# Patient Record
Sex: Male | Born: 1955 | Hispanic: No | Marital: Single | Smoking: Current every day smoker
Health system: Southern US, Community
[De-identification: ages and names within clinical notes are randomized; demographics above are authoritative.]

## PROBLEM LIST (undated history)

## (undated) DIAGNOSIS — I10 Essential (primary) hypertension: Secondary | ICD-10-CM

## (undated) DIAGNOSIS — S7290XA Unspecified fracture of unspecified femur, initial encounter for closed fracture: Secondary | ICD-10-CM

## (undated) DIAGNOSIS — M1990 Unspecified osteoarthritis, unspecified site: Secondary | ICD-10-CM

## (undated) DIAGNOSIS — R39198 Other difficulties with micturition: Secondary | ICD-10-CM

## (undated) DIAGNOSIS — M109 Gout, unspecified: Secondary | ICD-10-CM

---

## 2008-09-07 HISTORY — PX: CARPAL TUNNEL RELEASE: SHX101

## 2014-11-20 ENCOUNTER — Telehealth: Payer: Self-pay

## 2014-11-20 NOTE — Telephone Encounter (Signed)
PATIENT RECEIVED LETTER TO SCHEDULE COLONOSCOPY  PLEASE CALL [phone]

## 2014-11-22 NOTE — Telephone Encounter (Signed)
LMOM to call.

## 2014-11-23 NOTE — Telephone Encounter (Signed)
Pt called and said he will get his med list and contact his insurance co and call back to schedule.

## 2014-11-28 NOTE — Telephone Encounter (Signed)
Letter mailed to pt to call to schedule colonoscopy.  

## 2015-01-24 ENCOUNTER — Other Ambulatory Visit (HOSPITAL_COMMUNITY): Payer: Self-pay | Admitting: Internal Medicine

## 2015-01-24 DIAGNOSIS — S8992XA Unspecified injury of left lower leg, initial encounter: Secondary | ICD-10-CM

## 2015-01-24 DIAGNOSIS — F4310 Post-traumatic stress disorder, unspecified: Secondary | ICD-10-CM

## 2015-02-06 ENCOUNTER — Ambulatory Visit (HOSPITAL_COMMUNITY)
Admission: RE | Admit: 2015-02-06 | Discharge: 2015-02-06 | Disposition: A | Discharge status: Home / Self Care | Payer: BLUE CROSS/BLUE SHIELD | Source: Ambulatory Visit | Attending: Internal Medicine | Admitting: Internal Medicine

## 2015-02-06 DIAGNOSIS — M25562 Pain in left knee: Secondary | ICD-10-CM | POA: Diagnosis present

## 2015-02-06 DIAGNOSIS — M7989 Other specified soft tissue disorders: Secondary | ICD-10-CM | POA: Diagnosis not present

## 2015-02-06 DIAGNOSIS — S72142A Displaced intertrochanteric fracture of left femur, initial encounter for closed fracture: Secondary | ICD-10-CM | POA: Diagnosis not present

## 2015-02-06 DIAGNOSIS — F4310 Post-traumatic stress disorder, unspecified: Secondary | ICD-10-CM

## 2015-02-06 DIAGNOSIS — W19XXXA Unspecified fall, initial encounter: Secondary | ICD-10-CM | POA: Diagnosis not present

## 2015-02-06 DIAGNOSIS — N323 Diverticulum of bladder: Secondary | ICD-10-CM | POA: Diagnosis not present

## 2015-02-06 DIAGNOSIS — S8992XA Unspecified injury of left lower leg, initial encounter: Secondary | ICD-10-CM

## 2015-02-15 ENCOUNTER — Ambulatory Visit: Payer: Self-pay | Admitting: Orthopedic Surgery

## 2015-02-20 NOTE — Progress Notes (Signed)
Left message to set up pre op 02/15/15  0955 LM 6/13  0940 LM 02/19/15 1140 430pm 02/19/15 verified correct phone number with Parke Poisson at Dr [place] April 6.15/16 patient returned call. York Spaniel has been calling- hasnt left any message at [phone].  Give PST appt for 02/22/15--- Left message with Parke Poisson of appt

## 2015-02-22 ENCOUNTER — Encounter (HOSPITAL_COMMUNITY): Payer: Self-pay

## 2015-02-22 ENCOUNTER — Encounter (HOSPITAL_COMMUNITY)
Admission: RE | Admit: 2015-02-22 | Discharge: 2015-02-22 | Disposition: A | Discharge status: Home / Self Care | Payer: BLUE CROSS/BLUE SHIELD | Source: Ambulatory Visit | Attending: Orthopedic Surgery | Admitting: Orthopedic Surgery

## 2015-02-22 DIAGNOSIS — Z01818 Encounter for other preprocedural examination: Secondary | ICD-10-CM | POA: Insufficient documentation

## 2015-02-22 HISTORY — DX: Essential (primary) hypertension: I10

## 2015-02-22 HISTORY — DX: Unspecified osteoarthritis, unspecified site: M19.90

## 2015-02-22 HISTORY — DX: Gout, unspecified: M10.9

## 2015-02-22 HISTORY — DX: Other difficulties with micturition: R39.198

## 2015-02-22 HISTORY — DX: Unspecified fracture of unspecified femur, initial encounter for closed fracture: S72.90XA

## 2015-02-22 LAB — COMPREHENSIVE METABOLIC PANEL
ALBUMIN: 3.6 g/dL (ref 3.5–5.0)
ALK PHOS: 146 U/L — AB (ref 38–126)
ALT: 21 U/L (ref 17–63)
AST: 22 U/L (ref 15–41)
Anion gap: 9 (ref 5–15)
BILIRUBIN TOTAL: 0.1 mg/dL — AB (ref 0.3–1.2)
BUN: 6 mg/dL (ref 6–20)
CO2: 34 mmol/L — ABNORMAL HIGH (ref 22–32)
Calcium: 9.4 mg/dL (ref 8.9–10.3)
Chloride: 96 mmol/L — ABNORMAL LOW (ref 101–111)
Creatinine, Ser: 0.67 mg/dL (ref 0.61–1.24)
GFR calc Af Amer: >60 mL/min (ref >60)
GFR calc non Af Amer: >60 mL/min (ref >60)
GLUCOSE: 105 mg/dL — AB (ref 65–99)
POTASSIUM: 4.3 mmol/L (ref 3.5–5.1)
SODIUM: 139 mmol/L (ref 135–145)
Total Protein: 7.7 g/dL (ref 6.5–8.1)

## 2015-02-22 LAB — ABO/RH: ABO/RH(D): O NEG

## 2015-02-22 LAB — CBC
HCT: 43.7 % (ref 39.0–52.0)
Hemoglobin: 14.2 g/dL (ref 13.0–17.0)
MCH: 28.1 pg (ref 26.0–34.0)
MCHC: 32.5 g/dL (ref 30.0–36.0)
MCV: 86.4 fL (ref 78.0–100.0)
PLATELETS: 344 10*3/uL (ref 150–400)
RBC: 5.06 MIL/uL (ref 4.22–5.81)
RDW: 14.6 % (ref 11.5–15.5)
WBC: 9.4 10*3/uL (ref 4.0–10.5)

## 2015-02-22 LAB — PROTIME-INR
INR: 0.93 (ref 0.00–1.49)
Prothrombin Time: 12.7 seconds (ref 11.6–15.2)

## 2015-02-22 LAB — URINALYSIS, ROUTINE W REFLEX MICROSCOPIC
Bilirubin Urine: NEGATIVE
Glucose, UA: NEGATIVE mg/dL
HGB URINE DIPSTICK: NEGATIVE
Ketones, ur: NEGATIVE mg/dL
Leukocytes, UA: NEGATIVE
Nitrite: NEGATIVE
Protein, ur: NEGATIVE mg/dL
SPECIFIC GRAVITY, URINE: 1.012 (ref 1.005–1.030)
Urobilinogen, UA: 1 mg/dL (ref 0.0–1.0)
pH: 7 (ref 5.0–8.0)

## 2015-02-22 LAB — SURGICAL PCR SCREEN
MRSA, PCR: NEGATIVE
Staphylococcus aureus: POSITIVE — AB

## 2015-02-22 LAB — APTT: APTT: 34 s (ref 24–37)

## 2015-02-22 NOTE — H&P (Signed)
TOTAL HIP ADMISSION H&P  Patient is admitted for left total hip arthroplasty.  Subjective:  Chief Complaint: left hip pain  HPI: Chris Drake, 59 y.o. male, has a history of pain and functional disability in the left hip(s) due to trauma and arthritis and patient has failed non-surgical conservative treatments for greater than 12 weeks to include NSAID's and/or analgesics, use of assistive devices and activity modification.  Onset of symptoms was abrupt starting 3 months ago with rapidlly worsening course since that time.The patient noted no past surgery on the left hip(s).  Patient currently rates pain in the left hip at 8 out of 10 with activity. Patient has night pain, worsening of pain with activity and weight bearing, pain that interfers with activities of daily living and pain with passive range of motion. Patient has evidence of periarticular osteophytes, joint space narrowing and nonunion interochanteric fracture by imaging studies. This condition presents safety issues increasing the risk of falls. There is no current active infection.  Past Medical History Hypertension Urinary retention  Past Surgical History Carpal Tunnel Repair left Cataract Surgery right  Medications Medication History (Faith Lana Fish; 02/14/2015 3:51 PM) OxyCODONE HCl (  Tablet, Oral) Active. Fish Oil Active. Calcium (Oral)  - Active. Magnesium (Oral)  - Active. Vitamin D3 (Oral) - Active. Lisinopril (  Tablet, Oral) Active. Aspirin EC (  Tablet DR, Oral) Active.  NKDA  History  Substance Use Topics  . Smoking status: Smokes 1/2 pack per day  . Smokeless tobacco: Not on file  . Alcohol Use: "Occasional beer"      Review of Systems  Constitutional: Negative.   HENT: Negative.   Eyes: Negative.   Respiratory: Negative.   Cardiovascular: Negative.   Gastrointestinal: Negative.   Genitourinary: Negative.   Musculoskeletal: Positive for back pain, joint pain and falls. Negative  for myalgias and neck pain.       Left hip pain  Skin: Negative.   Neurological: Negative.   Endo/Heme/Allergies: Positive for environmental allergies. Negative for polydipsia. Does not bruise/bleed easily.  Psychiatric/Behavioral: Negative.     Objective:  Physical Exam  Constitutional: He is oriented to person, place, and time. He appears well-developed and well-nourished. No distress.  HENT:  Head: Normocephalic and atraumatic.  Right Ear: External ear normal.  Left Ear: External ear normal.  Mouth/Throat: Oropharynx is clear and moist.  Eyes: Conjunctivae and EOM are normal.  Neck: Normal range of motion. Neck supple.  Cardiovascular: Regular rhythm, normal heart sounds and intact distal pulses.  Tachycardia present.   No murmur heard. Respiratory: Effort normal and breath sounds normal. No respiratory distress. He has no wheezes.  GI: Soft. Bowel sounds are normal. He exhibits no distension. There is no tenderness.  Musculoskeletal:       Right hip: Normal.       Left hip: He exhibits decreased range of motion and decreased strength.       Right knee: Normal.       Left knee: Normal.  Neurological: He is alert and oriented to person, place, and time. He has normal strength and normal reflexes. No sensory deficit.  Skin: No rash noted. He is not diaphoretic. No erythema.  Psychiatric: He has a normal mood and affect. His behavior is normal.     Vitals  Weight: 185 lb Height: 68in Body Surface Area: 1.98 m Body Mass Index: 28.13 kg/m  Pulse: 104 (Regular)  BP: 124/78 (Sitting, Left Arm, Standard)  Imaging Review Plain radiographs demonstrate severe degenerative joint disease of the  left hip(s) as well as nonunion of intertroch fracture. The bone quality appears to be fair for age and reported activity level.  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 The treatment options including medical management, injection therapy, arthroscopy and arthroplasty were discussed at length. The risks and benefits of total hip arthroplasty were presented and reviewed. The risks due to aseptic loosening, infection, stiffness, dislocation/subluxation,  thromboembolic complications and other imponderables were discussed.  The patient acknowledged the explanation, agreed to proceed with the plan and consent was signed. Patient is being admitted for inpatient treatment for surgery, pain control, PT, OT, prophylactic antibiotics, VTE prophylaxis, progressive ambulation and ADL's and discharge planning.The patient is planning to be discharged home with home health services    TXA IV   Dimitri Ped, PA-C

## 2015-02-22 NOTE — Patient Instructions (Addendum)
YOUR PROCEDURE IS SCHEDULED ON :  02/26/15  REPORT TO [facility] MAIN ENTRANCE FOLLOW SIGNS TO SHORT STAY CENTER AT :  3:00 pm  CALL THIS NUMBER IF YOU HAVE PROBLEMS THE MORNING OF SURGERY [phone]  REMEMBER:ONLY 1 PER PERSON MAY GO TO SHORT STAY WITH YOU TO GET READY THE MORNING OF YOUR SURGERY  DO NOT EAT FOOD  AFTER MIDNIGHT  MAY HAVE CLEAR LIQUIDS UNTIL 11:00 AM  TAKE THESE MEDICINES THE MORNING OF SURGERY: ZYRTEC / MAY TAKE OXYCODONE OR OXYCONTIN IF NEEDED FOR PAIN     CLEAR LIQUID DIET   Foods Allowed                                                                     Foods Excluded  Coffee and tea, regular and decaf                             liquids that you cannot  Plain Jell-O in any flavor                                             see through such as: Fruit ices (not with fruit pulp)                                     milk, soups, orange juice  Iced Popsicles                                                 All solid food Carbonated beverages, regular and diet                                    Cranberry, grape and apple juices Sports drinks like Gatorade Lightly seasoned clear broth or consume(fat free) Sugar, honey syrup   _____________________________________________________________________    YOU MAY NOT HAVE ANY METAL ON YOUR BODY INCLUDING HAIR PINS AND PIERCING'S. DO NOT WEAR JEWELRY, MAKEUP, LOTIONS, POWDERS OR PERFUMES. DO NOT WEAR NAIL POLISH. DO NOT SHAVE 48 HRS PRIOR TO SURGERY. MEN MAY SHAVE FACE AND NECK.  DO NOT BRING VALUABLES TO HOSPITAL. [facility] IS NOT RESPONSIBLE FOR VALUABLES.  CONTACTS, DENTURES OR PARTIALS MAY NOT BE WORN TO SURGERY. LEAVE SUITCASE IN CAR. CAN BE BROUGHT TO ROOM AFTER SURGERY.  PATIENTS DISCHARGED THE DAY OF SURGERY WILL NOT BE ALLOWED TO DRIVE HOME.  PLEASE READ OVER THE FOLLOWING INSTRUCTION SHEETS _________________________________________________________________________________                                      [facility] - PREPARING FOR SURGERY  Before surgery, you can play an important role.  Because skin is not sterile, your skin needs to  be as free of germs as possible.  You can reduce the number of germs on your skin by washing with CHG (chlorahexidine gluconate) soap before surgery.  CHG is an antiseptic cleaner which kills germs and bonds with the skin to continue killing germs even after washing. Please DO NOT use if you have an allergy to CHG or antibacterial soaps.  If your skin becomes reddened/irritated stop using the CHG and inform your nurse when you arrive at Short Stay. Do not shave (including legs and underarms) for at least 48 hours prior to the first CHG shower.  You may shave your face. Please follow these instructions carefully:   1.  Shower with CHG Soap the night before surgery and the  morning of Surgery.   2.  If you choose to wash your hair, wash your hair first as usual with your  normal  Shampoo.   3.  After you shampoo, rinse your hair and body thoroughly to remove the  shampoo.                                         4.  Use CHG as you would any other liquid soap.  You can apply chg directly  to the skin and wash . Gently wash with scrungie or clean wascloth    5.  Apply the CHG Soap to your body ONLY FROM THE NECK DOWN.   Do not use on open                           Wound or open sores. Avoid contact with eyes, ears mouth and genitals (private parts).                        Genitals (private parts) with your normal soap.              6.  Wash thoroughly, paying special attention to the area where your surgery  will be performed.   7.  Thoroughly rinse your body with warm water from the neck down.   8.  DO NOT shower/wash with your normal soap after using and rinsing off  the CHG Soap .                9.  Pat yourself dry with a clean towel.             10.  Wear clean night clothes to bed after shower             11.  Place  clean sheets on your bed the night of your first shower and do not  sleep with pets.  Day of Surgery : Do not apply any lotions/deodorants the morning of surgery.  Please wear clean clothes to the hospital/surgery center.  FAILURE TO FOLLOW THESE INSTRUCTIONS MAY RESULT IN THE CANCELLATION OF YOUR SURGERY    PATIENT SIGNATURE_________________________________  ______________________________________________________________________     Chris Drake  An incentive spirometer is a tool that can help keep your lungs clear and active. This tool measures how well you are filling your lungs with each breath. Taking long deep breaths may help reverse or decrease the chance of developing breathing (pulmonary) problems (especially infection) following:  A long period of time when you are unable to move or be active. BEFORE THE PROCEDURE   If  the spirometer includes an indicator to show your best effort, your nurse or respiratory therapist will set it to a desired goal.  If possible, sit up straight or lean slightly forward. Try not to slouch.  Hold the incentive spirometer in an upright position. INSTRUCTIONS FOR USE   Sit on the edge of your bed if possible, or sit up as far as you can in bed or on a chair.  Hold the incentive spirometer in an upright position.  Breathe out normally.  Place the mouthpiece in your mouth and seal your lips tightly around it.  Breathe in slowly and as deeply as possible, raising the piston or the ball toward the top of the column.  Hold your breath for 3-5 seconds or for as long as possible. Allow the piston or ball to fall to the bottom of the column.  Remove the mouthpiece from your mouth and breathe out normally.  Rest for a few seconds and repeat Steps 1 through 7 at least 10 times every 1-2 hours when you are awake. Take your time and take a few normal breaths between deep breaths.  The spirometer may include an indicator to show your best  effort. Use the indicator as a goal to work toward during each repetition.  After each set of 10 deep breaths, practice coughing to be sure your lungs are clear. If you have an incision (the cut made at the time of surgery), support your incision when coughing by placing a pillow or rolled up towels firmly against it. Once you are able to get out of bed, walk around indoors and cough well. You may stop using the incentive spirometer when instructed by your caregiver.  RISKS AND COMPLICATIONS  Take your time so you do not get dizzy or light-headed.  If you are in pain, you may need to take or ask for pain medication before doing incentive spirometry. It is harder to take a deep breath if you are having pain. AFTER USE  Rest and breathe slowly and easily.  It can be helpful to keep track of a log of your progress. Your caregiver can provide you with a simple table to help with this. If you are using the spirometer at home, follow these instructions: SEEK MEDICAL CARE IF:   You are having difficultly using the spirometer.  You have trouble using the spirometer as often as instructed.  Your pain medication is not giving enough relief while using the spirometer.  You develop fever of 100.5 F (38.1 C) or higher. SEEK IMMEDIATE MEDICAL CARE IF:   You cough up bloody sputum that had not been present before.  You develop fever of 102 F (38.9 C) or greater.  You develop worsening pain at or near the incision site. MAKE SURE YOU:   Understand these instructions.  Will watch your condition.  Will get help right away if you are not doing well or get worse. Document Released: 01/04/2007 Document Revised: 11/16/2011 Document Reviewed: 03/07/2007 [employer] Patient Information 2014 [employer], [place].   ________________________________________________________________________  WHAT IS A BLOOD TRANSFUSION? Blood Transfusion Information  A transfusion is the replacement of blood or some of  its parts. Blood is made up of multiple cells which provide different functions.  Red blood cells carry oxygen and are used for blood loss replacement.  White blood cells fight against infection.  Platelets control bleeding.  Plasma helps clot blood.  Other blood products are available for specialized needs, such as hemophilia or other clotting disorders.  BEFORE THE TRANSFUSION  Who gives blood for transfusions?   Healthy volunteers who are fully evaluated to make sure their blood is safe. This is blood bank blood. Transfusion therapy is the safest it has ever been in the practice of medicine. Before blood is taken from a donor, a complete history is taken to make sure that person has no history of diseases nor engages in risky social behavior (examples are intravenous drug use or sexual activity with multiple partners). The donor's travel history is screened to minimize risk of transmitting infections, such as malaria. The donated blood is tested for signs of infectious diseases, such as HIV and hepatitis. The blood is then tested to be sure it is compatible with you in order to minimize the chance of a transfusion reaction. If you or a relative donates blood, this is often done in anticipation of surgery and is not appropriate for emergency situations. It takes many days to process the donated blood. RISKS AND COMPLICATIONS Although transfusion therapy is very safe and saves many lives, the main dangers of transfusion include:   Getting an infectious disease.  Developing a transfusion reaction. This is an allergic reaction to something in the blood you were given. Every precaution is taken to prevent this. The decision to have a blood transfusion has been considered carefully by your caregiver before blood is given. Blood is not given unless the benefits outweigh the risks. AFTER THE TRANSFUSION  Right after receiving a blood transfusion, you will usually feel much better and more  energetic. This is especially true if your red blood cells have gotten low (anemic). The transfusion raises the level of the red blood cells which carry oxygen, and this usually causes an energy increase.  The nurse administering the transfusion will monitor you carefully for complications. HOME CARE INSTRUCTIONS  No special instructions are needed after a transfusion. You may find your energy is better. Speak with your caregiver about any limitations on activity for underlying diseases you may have. SEEK MEDICAL CARE IF:   Your condition is not improving after your transfusion.  You develop redness or irritation at the intravenous (IV) site. SEEK IMMEDIATE MEDICAL CARE IF:  Any of the following symptoms occur over the next 12 hours:  Shaking chills.  You have a temperature by mouth above 102 F (38.9 C), not controlled by medicine.  Chest, back, or muscle pain.  People around you feel you are not acting correctly or are confused.  Shortness of breath or difficulty breathing.  Dizziness and fainting.  You get a rash or develop hives.  You have a decrease in urine output.  Your urine turns a dark color or changes to pink, red, or brown. Any of the following symptoms occur over the next 10 days:  You have a temperature by mouth above 102 F (38.9 C), not controlled by medicine.  Shortness of breath.  Weakness after normal activity.  The white part of the eye turns yellow (jaundice).  You have a decrease in the amount of urine or are urinating less often.  Your urine turns a dark color or changes to pink, red, or brown. Document Released: 08/21/2000 Document Revised: 11/16/2011 Document Reviewed: 04/09/2008 [facility] Patient Information 2014 [place], [place].  _______________________________________________________________________

## 2015-02-25 NOTE — Anesthesia Preprocedure Evaluation (Addendum)
Anesthesia Evaluation  Patient identified by MRN, date of birth, ID band Patient awake    Reviewed: Allergy & Precautions, NPO status , Patient's Chart, lab work & pertinent test results  History of Anesthesia Complications Negative for: history of anesthetic complications  Airway Mallampati: II  TM Distance: >3 FB Neck ROM: Full    Dental no notable dental hx. (+) Dental Advisory Given   Pulmonary neg pulmonary ROS, Current Smoker,  breath sounds clear to auscultation  Pulmonary exam normal       Cardiovascular hypertension, Pt. on medications Normal cardiovascular examRhythm:Regular Rate:Normal     Neuro/Psych negative neurological ROS  negative psych ROS   GI/Hepatic negative GI ROS, Neg liver ROS,   Endo/Other  negative endocrine ROS  Renal/GU negative Renal ROS  negative genitourinary   Musculoskeletal negative musculoskeletal ROS (+) Arthritis -,   Abdominal   Peds negative pediatric ROS (+)  Hematology negative hematology ROS (+)   Anesthesia Other Findings   Reproductive/Obstetrics negative OB ROS                            Anesthesia Physical Anesthesia Plan  ASA: II  Anesthesia Plan: General   Post-op Pain Management:    Induction: Intravenous  Airway Management Planned: Oral ETT  Additional Equipment:   Intra-op Plan:   Post-operative Plan:   Informed Consent: I have reviewed the patients History and Physical, chart, labs and discussed the procedure including the risks, benefits and alternatives for the proposed anesthesia with the patient or authorized representative who has indicated his/her understanding and acceptance.   Dental advisory given  Plan Discussed with: CRNA and Surgeon  Anesthesia Plan Comments:        Anesthesia Quick Evaluation

## 2015-02-26 ENCOUNTER — Inpatient Hospital Stay (HOSPITAL_COMMUNITY): Payer: BLUE CROSS/BLUE SHIELD | Admitting: Anesthesiology

## 2015-02-26 ENCOUNTER — Inpatient Hospital Stay (HOSPITAL_COMMUNITY): Payer: BLUE CROSS/BLUE SHIELD

## 2015-02-26 ENCOUNTER — Encounter (HOSPITAL_COMMUNITY)
Admission: RE | Disposition: A | Discharge status: Skilled Nursing Facility | Payer: Self-pay | Source: Ambulatory Visit | Attending: Orthopedic Surgery

## 2015-02-26 ENCOUNTER — Inpatient Hospital Stay (HOSPITAL_COMMUNITY)
Admission: RE | Admit: 2015-02-26 | Discharge: 2015-03-04 | DRG: 470 | Disposition: A | Discharge status: Skilled Nursing Facility | Payer: BLUE CROSS/BLUE SHIELD | Source: Ambulatory Visit | Attending: Orthopedic Surgery | Admitting: Orthopedic Surgery

## 2015-02-26 ENCOUNTER — Encounter (HOSPITAL_COMMUNITY): Payer: Self-pay | Admitting: *Deleted

## 2015-02-26 DIAGNOSIS — Z01812 Encounter for preprocedural laboratory examination: Secondary | ICD-10-CM

## 2015-02-26 DIAGNOSIS — F17210 Nicotine dependence, cigarettes, uncomplicated: Secondary | ICD-10-CM | POA: Diagnosis present

## 2015-02-26 DIAGNOSIS — S72142K Displaced intertrochanteric fracture of left femur, subsequent encounter for closed fracture with nonunion: Principal | ICD-10-CM

## 2015-02-26 DIAGNOSIS — Z7982 Long term (current) use of aspirin: Secondary | ICD-10-CM | POA: Diagnosis not present

## 2015-02-26 DIAGNOSIS — M1612 Unilateral primary osteoarthritis, left hip: Secondary | ICD-10-CM | POA: Diagnosis present

## 2015-02-26 DIAGNOSIS — W19XXXD Unspecified fall, subsequent encounter: Secondary | ICD-10-CM | POA: Diagnosis present

## 2015-02-26 DIAGNOSIS — A150 Tuberculosis of lung: Secondary | ICD-10-CM

## 2015-02-26 DIAGNOSIS — Z96649 Presence of unspecified artificial hip joint: Secondary | ICD-10-CM

## 2015-02-26 DIAGNOSIS — S72002P Fracture of unspecified part of neck of left femur, subsequent encounter for closed fracture with malunion: Secondary | ICD-10-CM

## 2015-02-26 DIAGNOSIS — Z79899 Other long term (current) drug therapy: Secondary | ICD-10-CM

## 2015-02-26 DIAGNOSIS — R339 Retention of urine, unspecified: Secondary | ICD-10-CM | POA: Diagnosis present

## 2015-02-26 DIAGNOSIS — I10 Essential (primary) hypertension: Secondary | ICD-10-CM | POA: Diagnosis present

## 2015-02-26 DIAGNOSIS — M25552 Pain in left hip: Secondary | ICD-10-CM | POA: Diagnosis present

## 2015-02-26 DIAGNOSIS — S72009P Fracture of unspecified part of neck of unspecified femur, subsequent encounter for closed fracture with malunion: Secondary | ICD-10-CM | POA: Diagnosis present

## 2015-02-26 DIAGNOSIS — M169 Osteoarthritis of hip, unspecified: Secondary | ICD-10-CM | POA: Diagnosis present

## 2015-02-26 HISTORY — PX: TOTAL HIP ARTHROPLASTY: SHX124

## 2015-02-26 LAB — TYPE AND SCREEN
ABO/RH(D): O NEG
ABO/RH(D): O NEG
Antibody Screen: NEGATIVE
Antibody Screen: NEGATIVE

## 2015-02-26 SURGERY — ARTHROPLASTY, HIP, TOTAL,POSTERIOR APPROACH
Anesthesia: General | Site: Hip | Laterality: Left

## 2015-02-26 MED ORDER — DEXAMETHASONE SODIUM PHOSPHATE 10 MG/ML IJ SOLN
10.0000 mg | Freq: Once | INTRAMUSCULAR | Status: AC
  Administered 2015-02-27: 10 mg via INTRAVENOUS
  Filled 2015-02-26: qty 1

## 2015-02-26 MED ORDER — DEXAMETHASONE SODIUM PHOSPHATE 10 MG/ML IJ SOLN
INTRAMUSCULAR | Status: AC
  Filled 2015-02-26: qty 1

## 2015-02-26 MED ORDER — HYDROMORPHONE HCL 1 MG/ML IJ SOLN
INTRAMUSCULAR | Status: AC
  Administered 2015-02-26: 1 mg
  Filled 2015-02-26: qty 1

## 2015-02-26 MED ORDER — MIDAZOLAM HCL 2 MG/2ML IJ SOLN
INTRAMUSCULAR | Status: AC
  Filled 2015-02-26: qty 2

## 2015-02-26 MED ORDER — FENTANYL CITRATE (PF) 100 MCG/2ML IJ SOLN
INTRAMUSCULAR | Status: AC
  Filled 2015-02-26: qty 2

## 2015-02-26 MED ORDER — HYDROMORPHONE HCL 2 MG/ML IJ SOLN
INTRAMUSCULAR | Status: AC
  Filled 2015-02-26: qty 1

## 2015-02-26 MED ORDER — BUPIVACAINE HCL 0.25 % IJ SOLN
INTRAMUSCULAR | Status: DC | PRN
Start: 2015-02-26 — End: 2015-02-26
  Administered 2015-02-26: 20 mL

## 2015-02-26 MED ORDER — HYDROMORPHONE HCL 1 MG/ML IJ SOLN
INTRAMUSCULAR | Status: AC
  Filled 2015-02-26: qty 1

## 2015-02-26 MED ORDER — CEFAZOLIN SODIUM-DEXTROSE 2-3 GM-% IV SOLR
INTRAVENOUS | Status: AC
  Filled 2015-02-26: qty 50

## 2015-02-26 MED ORDER — FLEET ENEMA 7-19 GM/118ML RE ENEM: 1.0000 | ENEMA | Freq: Once | RECTAL | Status: AC | PRN

## 2015-02-26 MED ORDER — HYDROMORPHONE HCL 1 MG/ML IJ SOLN
1.0000 mg | Freq: Once | INTRAMUSCULAR | Status: AC
  Administered 2015-02-26: 1 mg via INTRAVENOUS

## 2015-02-26 MED ORDER — METOCLOPRAMIDE HCL 5 MG/ML IJ SOLN: 5.0000 mg | Freq: Three times a day (TID) | INTRAMUSCULAR | Status: DC | PRN

## 2015-02-26 MED ORDER — LACTATED RINGERS IV SOLN
INTRAVENOUS | Status: DC
  Administered 2015-02-26: 18:00:00 via INTRAVENOUS
  Administered 2015-02-26: 1000 mL via INTRAVENOUS

## 2015-02-26 MED ORDER — ONDANSETRON HCL 4 MG/2ML IJ SOLN
INTRAMUSCULAR | Status: AC
  Filled 2015-02-26: qty 2

## 2015-02-26 MED ORDER — BISACODYL 10 MG RE SUPP: 10.0000 mg | Freq: Every day | RECTAL | Status: DC | PRN

## 2015-02-26 MED ORDER — MIDAZOLAM HCL 10 MG/10ML IJ SOLN
1.0000 mg | Freq: Once | INTRAMUSCULAR | Status: AC
  Administered 2015-02-26: 1 mg via INTRAVENOUS

## 2015-02-26 MED ORDER — BUPIVACAINE HCL (PF) 0.25 % IJ SOLN
INTRAMUSCULAR | Status: AC
  Filled 2015-02-26: qty 30

## 2015-02-26 MED ORDER — ONDANSETRON HCL 4 MG/2ML IJ SOLN: 4.0000 mg | Freq: Once | INTRAMUSCULAR | Status: DC | PRN

## 2015-02-26 MED ORDER — MIDAZOLAM HCL 5 MG/5ML IJ SOLN
INTRAMUSCULAR | Status: DC | PRN
  Administered 2015-02-26: 2 mg via INTRAVENOUS

## 2015-02-26 MED ORDER — RIVAROXABAN 10 MG PO TABS
10.0000 mg | ORAL_TABLET | Freq: Every day | ORAL | Status: DC
  Administered 2015-02-27 – 2015-03-04 (×6): 10 mg via ORAL
  Filled 2015-02-26 (×7): qty 1

## 2015-02-26 MED ORDER — MORPHINE SULFATE 2 MG/ML IJ SOLN
1.0000 mg | INTRAMUSCULAR | Status: DC | PRN
  Administered 2015-02-26 – 2015-02-27 (×6): 2 mg via INTRAVENOUS
  Filled 2015-02-26 (×6): qty 1

## 2015-02-26 MED ORDER — 0.9 % SODIUM CHLORIDE (POUR BTL) OPTIME
TOPICAL | Status: DC | PRN
  Administered 2015-02-26: 1000 mL

## 2015-02-26 MED ORDER — ONDANSETRON HCL 4 MG/2ML IJ SOLN: 4.0000 mg | Freq: Four times a day (QID) | INTRAMUSCULAR | Status: DC | PRN

## 2015-02-26 MED ORDER — METHOCARBAMOL 500 MG PO TABS
500.0000 mg | ORAL_TABLET | Freq: Four times a day (QID) | ORAL | Status: DC | PRN
  Administered 2015-02-27 – 2015-03-04 (×15): 500 mg via ORAL
  Filled 2015-02-26 (×15): qty 1

## 2015-02-26 MED ORDER — CEFAZOLIN SODIUM-DEXTROSE 2-3 GM-% IV SOLR
2.0000 g | INTRAVENOUS | Status: AC
  Administered 2015-02-26: 2 g via INTRAVENOUS

## 2015-02-26 MED ORDER — DEXAMETHASONE SODIUM PHOSPHATE 10 MG/ML IJ SOLN
10.0000 mg | Freq: Once | INTRAMUSCULAR | Status: AC
  Administered 2015-02-26: 10 mg via INTRAVENOUS

## 2015-02-26 MED ORDER — ACETAMINOPHEN 500 MG PO TABS
1000.0000 mg | ORAL_TABLET | Freq: Four times a day (QID) | ORAL | Status: AC
  Administered 2015-02-26 – 2015-02-27 (×4): 1000 mg via ORAL
  Filled 2015-02-26 (×4): qty 2

## 2015-02-26 MED ORDER — SODIUM CHLORIDE 0.9 % IV SOLN
INTRAVENOUS | Status: DC
  Administered 2015-02-26 – 2015-02-27 (×3): via INTRAVENOUS

## 2015-02-26 MED ORDER — ACETAMINOPHEN 650 MG RE SUPP
650.0000 mg | Freq: Four times a day (QID) | RECTAL | Status: DC | PRN
Start: 2015-02-27 — End: 2015-03-04

## 2015-02-26 MED ORDER — CHLORHEXIDINE GLUCONATE 4 % EX LIQD: 60.0000 mL | Freq: Once | CUTANEOUS | Status: DC

## 2015-02-26 MED ORDER — ACETAMINOPHEN 10 MG/ML IV SOLN
1000.0000 mg | Freq: Once | INTRAVENOUS | Status: AC
  Administered 2015-02-26: 1000 mg via INTRAVENOUS
  Filled 2015-02-26: qty 100

## 2015-02-26 MED ORDER — HYDROMORPHONE HCL 1 MG/ML IJ SOLN
INTRAMUSCULAR | Status: DC | PRN
  Administered 2015-02-26 (×2): 1 mg via INTRAVENOUS

## 2015-02-26 MED ORDER — LIDOCAINE HCL (CARDIAC) 20 MG/ML IV SOLN
INTRAVENOUS | Status: AC
  Filled 2015-02-26: qty 5

## 2015-02-26 MED ORDER — MENTHOL 3 MG MT LOZG: 1.0000 | LOZENGE | OROMUCOSAL | Status: DC | PRN

## 2015-02-26 MED ORDER — METOCLOPRAMIDE HCL 10 MG PO TABS: 5.0000 mg | ORAL_TABLET | Freq: Three times a day (TID) | ORAL | Status: DC | PRN

## 2015-02-26 MED ORDER — PROPOFOL 10 MG/ML IV BOLUS
INTRAVENOUS | Status: AC
  Filled 2015-02-26: qty 20

## 2015-02-26 MED ORDER — ACETAMINOPHEN 325 MG PO TABS
650.0000 mg | ORAL_TABLET | Freq: Four times a day (QID) | ORAL | Status: DC | PRN
  Administered 2015-03-01 – 2015-03-04 (×4): 650 mg via ORAL
  Filled 2015-02-26 (×4): qty 2

## 2015-02-26 MED ORDER — CEFAZOLIN SODIUM-DEXTROSE 2-3 GM-% IV SOLR
2.0000 g | Freq: Four times a day (QID) | INTRAVENOUS | Status: AC
  Administered 2015-02-26 – 2015-02-27 (×2): 2 g via INTRAVENOUS
  Filled 2015-02-26 (×2): qty 50

## 2015-02-26 MED ORDER — FENTANYL CITRATE (PF) 100 MCG/2ML IJ SOLN
INTRAMUSCULAR | Status: DC | PRN
  Administered 2015-02-26 (×6): 50 ug via INTRAVENOUS

## 2015-02-26 MED ORDER — ACETAMINOPHEN 10 MG/ML IV SOLN
INTRAVENOUS | Status: AC
  Filled 2015-02-26: qty 100

## 2015-02-26 MED ORDER — DEXTROSE 5 % IV SOLN
500.0000 mg | Freq: Four times a day (QID) | INTRAVENOUS | Status: DC | PRN
  Administered 2015-02-26: 500 mg via INTRAVENOUS
  Filled 2015-02-26 (×3): qty 5

## 2015-02-26 MED ORDER — POLYETHYLENE GLYCOL 3350 17 G PO PACK
17.0000 g | PACK | Freq: Every day | ORAL | Status: DC | PRN
  Administered 2015-03-01 – 2015-03-04 (×3): 17 g via ORAL
  Filled 2015-02-26 (×3): qty 1

## 2015-02-26 MED ORDER — LORATADINE 10 MG PO TABS
10.0000 mg | ORAL_TABLET | Freq: Every day | ORAL | Status: DC
  Administered 2015-02-27 – 2015-03-04 (×6): 10 mg via ORAL
  Filled 2015-02-26 (×6): qty 1

## 2015-02-26 MED ORDER — PHENOL 1.4 % MT LIQD: 1.0000 | OROMUCOSAL | Status: DC | PRN

## 2015-02-26 MED ORDER — TRANEXAMIC ACID 1000 MG/10ML IV SOLN
1000.0000 mg | INTRAVENOUS | Status: AC
  Administered 2015-02-26: 1000 mg via INTRAVENOUS
  Filled 2015-02-26: qty 10

## 2015-02-26 MED ORDER — SODIUM CHLORIDE 0.9 % IV SOLN: INTRAVENOUS | Status: DC

## 2015-02-26 MED ORDER — TRAMADOL HCL 50 MG PO TABS
50.0000 mg | ORAL_TABLET | Freq: Four times a day (QID) | ORAL | Status: DC | PRN
  Administered 2015-02-28 – 2015-03-03 (×3): 100 mg via ORAL
  Filled 2015-02-26 (×3): qty 2

## 2015-02-26 MED ORDER — PROPOFOL 10 MG/ML IV BOLUS
INTRAVENOUS | Status: DC | PRN
  Administered 2015-02-26: 20 mg via INTRAVENOUS
  Administered 2015-02-26: 180 mg via INTRAVENOUS
  Administered 2015-02-26: 20 mg via INTRAVENOUS

## 2015-02-26 MED ORDER — LIDOCAINE HCL (CARDIAC) 20 MG/ML IV SOLN
INTRAVENOUS | Status: DC | PRN
  Administered 2015-02-26: 30 mg via INTRAVENOUS

## 2015-02-26 MED ORDER — ONDANSETRON HCL 4 MG/2ML IJ SOLN
INTRAMUSCULAR | Status: DC | PRN
  Administered 2015-02-26: 4 mg via INTRAVENOUS

## 2015-02-26 MED ORDER — OXYCODONE HCL 5 MG PO TABS: 30.0000 mg | ORAL_TABLET | ORAL | Status: DC | PRN

## 2015-02-26 MED ORDER — STERILE WATER FOR IRRIGATION IR SOLN
Status: DC | PRN
  Administered 2015-02-26: 1500 mL

## 2015-02-26 MED ORDER — FENTANYL CITRATE (PF) 100 MCG/2ML IJ SOLN
INTRAMUSCULAR | Status: AC
Start: 2015-02-26 — End: 2015-02-26
  Filled 2015-02-26: qty 2

## 2015-02-26 MED ORDER — SODIUM CHLORIDE 0.9 % IJ SOLN
INTRAMUSCULAR | Status: AC
  Filled 2015-02-26: qty 50

## 2015-02-26 MED ORDER — DOCUSATE SODIUM 100 MG PO CAPS
100.0000 mg | ORAL_CAPSULE | Freq: Two times a day (BID) | ORAL | Status: DC
  Administered 2015-02-26 – 2015-03-04 (×11): 100 mg via ORAL
  Filled 2015-02-26 (×8): qty 1

## 2015-02-26 MED ORDER — DIPHENHYDRAMINE HCL 12.5 MG/5ML PO ELIX: 12.5000 mg | ORAL_SOLUTION | ORAL | Status: DC | PRN

## 2015-02-26 MED ORDER — ONDANSETRON HCL 4 MG PO TABS: 4.0000 mg | ORAL_TABLET | Freq: Four times a day (QID) | ORAL | Status: DC | PRN

## 2015-02-26 MED ORDER — FENTANYL CITRATE (PF) 100 MCG/2ML IJ SOLN
25.0000 ug | INTRAMUSCULAR | Status: DC | PRN
  Administered 2015-02-26 (×4): 50 ug via INTRAVENOUS

## 2015-02-26 MED ORDER — HYDROMORPHONE HCL 1 MG/ML IJ SOLN
0.2500 mg | INTRAMUSCULAR | Status: DC | PRN
  Administered 2015-02-26 (×3): 0.5 mg via INTRAVENOUS

## 2015-02-26 MED ORDER — OXYCODONE HCL ER 20 MG PO T12A
60.0000 mg | EXTENDED_RELEASE_TABLET | Freq: Two times a day (BID) | ORAL | Status: DC
  Administered 2015-02-26 – 2015-03-04 (×12): 60 mg via ORAL
  Filled 2015-02-26 (×12): qty 3

## 2015-02-26 MED ORDER — SUCCINYLCHOLINE CHLORIDE 20 MG/ML IJ SOLN
INTRAMUSCULAR | Status: DC | PRN
  Administered 2015-02-26: 100 mg via INTRAVENOUS

## 2015-02-26 MED ORDER — OXYCODONE HCL 5 MG PO TABS
30.0000 mg | ORAL_TABLET | ORAL | Status: DC | PRN
  Administered 2015-02-26 – 2015-03-04 (×29): 30 mg via ORAL
  Filled 2015-02-26 (×29): qty 6

## 2015-02-26 MED ORDER — BUPIVACAINE LIPOSOME 1.3 % IJ SUSP
20.0000 mL | Freq: Once | INTRAMUSCULAR | Status: DC
  Filled 2015-02-26: qty 20

## 2015-02-26 SURGICAL SUPPLY — 60 items
BAG DECANTER FOR FLEXI CONT (MISCELLANEOUS) ×3 IMPLANT
BAG SPEC THK2 15X12 ZIP CLS (MISCELLANEOUS)
BAG ZIPLOCK 12X15 (MISCELLANEOUS) IMPLANT
BIT DRILL 2.8X128 (BIT) ×2 IMPLANT
BIT DRILL 2.8X128MM (BIT) ×1
BLADE EXTENDED COATED 6.5IN (ELECTRODE) ×3 IMPLANT
BLADE SAW SAG 73X25 THK (BLADE) ×2
BLADE SAW SGTL 73X25 THK (BLADE) ×1 IMPLANT
CAPT HIP TOTAL 2 ×2 IMPLANT
CLOSURE WOUND 1/2 X4 (GAUZE/BANDAGES/DRESSINGS) ×2
DECANTER SPIKE VIAL GLASS SM (MISCELLANEOUS) ×3 IMPLANT
DRAPE INCISE IOBAN 66X45 STRL (DRAPES) ×3 IMPLANT
DRAPE ORTHO SPLIT 77X108 STRL (DRAPES) ×6
DRAPE POUCH INSTRU U-SHP 10X18 (DRAPES) ×3 IMPLANT
DRAPE SURG ORHT 6 SPLT 77X108 (DRAPES) ×2 IMPLANT
DRAPE U-SHAPE 47X51 STRL (DRAPES) ×3 IMPLANT
DRSG ADAPTIC 3X8 NADH LF (GAUZE/BANDAGES/DRESSINGS) ×3 IMPLANT
DRSG MEPILEX BORDER 4X4 (GAUZE/BANDAGES/DRESSINGS) ×3 IMPLANT
DRSG MEPILEX BORDER 4X8 (GAUZE/BANDAGES/DRESSINGS) ×3 IMPLANT
DURAPREP 26ML APPLICATOR (WOUND CARE) ×3 IMPLANT
ELECT REM PT RETURN 9FT ADLT (ELECTROSURGICAL) ×3
ELECTRODE REM PT RTRN 9FT ADLT (ELECTROSURGICAL) ×1 IMPLANT
EVACUATOR 1/8 PVC DRAIN (DRAIN) ×3 IMPLANT
FACESHIELD WRAPAROUND (MASK) ×12 IMPLANT
FACESHIELD WRAPAROUND OR TEAM (MASK) ×4 IMPLANT
GAUZE SPONGE 2X2 8PLY STRL LF (GAUZE/BANDAGES/DRESSINGS) IMPLANT
GAUZE SPONGE 4X4 12PLY STRL (GAUZE/BANDAGES/DRESSINGS) ×3 IMPLANT
GLOVE BIO SURGEON STRL SZ7.5 (GLOVE) IMPLANT
GLOVE BIO SURGEON STRL SZ8 (GLOVE) ×3 IMPLANT
GLOVE BIOGEL PI IND STRL 8 (GLOVE) ×1 IMPLANT
GLOVE BIOGEL PI INDICATOR 8 (GLOVE) ×2
GLOVE SURG SS PI 6.5 STRL IVOR (GLOVE) IMPLANT
GOWN STRL REUS W/TWL LRG LVL3 (GOWN DISPOSABLE) ×3 IMPLANT
GOWN STRL REUS W/TWL XL LVL3 (GOWN DISPOSABLE) IMPLANT
IMMOBILIZER KNEE 20 (SOFTGOODS) ×3
IMMOBILIZER KNEE 20 THIGH 36 (SOFTGOODS) ×1 IMPLANT
KIT BASIN OR (CUSTOM PROCEDURE TRAY) ×3 IMPLANT
MANIFOLD NEPTUNE II (INSTRUMENTS) ×3 IMPLANT
NDL SAFETY ECLIPSE 18X1.5 (NEEDLE) ×3 IMPLANT
NEEDLE HYPO 18GX1.5 SHARP (NEEDLE) ×9
NS IRRIG 1000ML POUR BTL (IV SOLUTION) ×3 IMPLANT
PACK TOTAL JOINT (CUSTOM PROCEDURE TRAY) ×3 IMPLANT
PADDING CAST COTTON 6X4 STRL (CAST SUPPLIES) ×3 IMPLANT
PASSER SUT SWANSON 36MM LOOP (INSTRUMENTS) ×3 IMPLANT
PEN SKIN MARKING BROAD (MISCELLANEOUS) ×3 IMPLANT
POSITIONER SURGICAL ARM (MISCELLANEOUS) ×3 IMPLANT
SPONGE GAUZE 2X2 STER 10/PKG (GAUZE/BANDAGES/DRESSINGS) ×2
STRIP CLOSURE SKIN 1/2X4 (GAUZE/BANDAGES/DRESSINGS) ×4 IMPLANT
SUT ETHIBOND NAB CT1 #1 30IN (SUTURE) ×6 IMPLANT
SUT MNCRL AB 4-0 PS2 18 (SUTURE) ×3 IMPLANT
SUT VIC AB 2-0 CT1 27 (SUTURE) ×12
SUT VIC AB 2-0 CT1 TAPERPNT 27 (SUTURE) ×3 IMPLANT
SUT VLOC 180 0 24IN GS25 (SUTURE) ×5 IMPLANT
SYR 20CC LL (SYRINGE) ×3 IMPLANT
SYR 50ML LL SCALE MARK (SYRINGE) ×6 IMPLANT
TOWEL OR 17X26 10 PK STRL BLUE (TOWEL DISPOSABLE) ×6 IMPLANT
TOWEL OR NON WOVEN STRL DISP B (DISPOSABLE) ×3 IMPLANT
TRAY FOLEY W/METER SILVER 14FR (SET/KITS/TRAYS/PACK) ×3 IMPLANT
WATER STERILE IRR 1500ML POUR (IV SOLUTION) ×3 IMPLANT
YANKAUER SUCT BULB TIP 10FT TU (MISCELLANEOUS) ×3 IMPLANT

## 2015-02-26 NOTE — Transfer of Care (Signed)
Immediate Anesthesia Transfer of Care Note  Patient: Oneal Grout  Procedure(s) Performed: Procedure(s): TOTAL LEFT HIP ARTHROPLASTY (POSTERIOR APPROACH)  (Left)  Patient Location: PACU  Anesthesia Type:General  Level of Consciousness:  sedated, patient cooperative and responds to stimulation  Airway & Oxygen Therapy:Patient Spontanous Breathing and Patient connected to face mask oxgen  Post-op Assessment:  Report given to PACU RN and Post -op Vital signs reviewed and stable  Post vital signs:  Reviewed and stable  Last Vitals:  Filed Vitals:   02/26/15 1354  BP: 152/92  Pulse: 115  Temp: 36.4 C  Resp: 16    Complications: No apparent anesthesia complications

## 2015-02-26 NOTE — Progress Notes (Signed)
Patient had gone through PST and was in short stay 1345. RN goes to transport him to surgery and he states his name is spelled wrong. Admitting notified. 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 Patient to be transported to OR holding and RN will get new labels to OR.

## 2015-02-26 NOTE — Anesthesia Postprocedure Evaluation (Signed)
  Anesthesia Post-op Note  Patient: Chris Drake  Procedure(s) Performed: Procedure(s) (LRB): TOTAL LEFT HIP ARTHROPLASTY (POSTERIOR APPROACH)  (Left)  Patient Location: PACU  Anesthesia Type: General  Level of Consciousness: awake and alert   Airway and Oxygen Therapy: Patient Spontanous Breathing  Post-op Pain: mild  Post-op Assessment: Post-op Vital signs reviewed, Patient's Cardiovascular Status Stable, Respiratory Function Stable, Patent Airway and No signs of Nausea or vomiting  Last Vitals:  Filed Vitals:   02/26/15 1951  BP: 160/101  Pulse: 113  Temp: 36.5 C  Resp: 20    Post-op Vital Signs: stable   Complications: No apparent anesthesia complications

## 2015-02-26 NOTE — Progress Notes (Signed)
CRITICAL VALUE ALERT  Critical value received:  Hip xray result, see report  Date of notification:  02/26/15  Time of notification:  1923  Critical value read back:Yes.    Nurse who received alert:  Thedore Mins RN  MD notified (1st page):  Aluisio  Time of first page:  1924  MD notified (2nd page):  Time of second page:  Responding MD:  Aluisio  Time MD responded:  [phone]

## 2015-02-26 NOTE — Interval H&P Note (Signed)
History and Physical Interval Note:  02/26/2015 3:26 PM  Chris Drake  has presented today for surgery, with the diagnosis of NON UNION LEFT INTERTROCHANTERIC FEMUR FRACTURE   The various methods of treatment have been discussed with the patient and family. After consideration of risks, benefits and other options for treatment, the patient has consented to  Procedure(s): TOTAL LEFT HIP ARTHROPLASTY (POSTERIOR APPROACH)  (Left) as a surgical intervention .  The patient's history has been reviewed, patient examined, no change in status, stable for surgery.  I have reviewed the patient's chart and labs.  Questions were answered to the patient's satisfaction.     Loanne Drilling

## 2015-02-26 NOTE — Anesthesia Procedure Notes (Signed)
Procedure Name: Intubation Date/Time: 02/26/2015 4:47 PM Performed by: Early Osmond E Pre-anesthesia Checklist: Patient identified, Emergency Drugs available, Suction available and Patient being monitored Patient Re-evaluated:Patient Re-evaluated prior to inductionOxygen Delivery Method: Circle System Utilized Preoxygenation: Pre-oxygenation with 100% oxygen Intubation Type: IV induction Ventilation: Mask ventilation without difficulty Laryngoscope Size: Mac and 3 Grade View: Grade I Tube type: Oral Tube size: 7.5 mm Number of attempts: 1 Airway Equipment and Method: Stylet Placement Confirmation: ETT inserted through vocal cords under direct vision,  positive ETCO2 and breath sounds checked- equal and bilateral Secured at: 21 cm Tube secured with: Tape Dental Injury: Teeth and Oropharynx as per pre-operative assessment

## 2015-02-26 NOTE — Brief Op Note (Signed)
02/26/2015  5:46 PM  PATIENT:  Chris Drake  59 y.o. male  PRE-OPERATIVE DIAGNOSIS:  NON UNION LEFT INTERTROCHANTERIC FEMUR FRACTURE   POST-OPERATIVE DIAGNOSIS:  NON UNION LEFT INTERTROCHANTERIC FEMUR FRACTURE   PROCEDURE:  Procedure(s): TOTAL LEFT HIP ARTHROPLASTY (POSTERIOR APPROACH)  (Left)  SURGEON:  Surgeon(s) and Role:    * Ollen Gross, MD - Primary  PHYSICIAN ASSISTANT:   ASSISTANTS: Avel Peace, PA-C   ANESTHESIA:   general  EBL:  Total I/O In: -  Out: 350 [Blood:350]  BLOOD ADMINISTERED:none  DRAINS: (Medium) Hemovact drain(s) in the left hip with  Suction Open   LOCAL MEDICATIONS USED:  MARCAINE     COUNTS:  YES  TOURNIQUET:  * No tourniquets in log *  DICTATION: .Other Dictation: Dictation Number [id number]  PLAN OF CARE: Admit to inpatient   PATIENT DISPOSITION:  PACU - hemodynamically stable.

## 2015-02-27 ENCOUNTER — Encounter (HOSPITAL_COMMUNITY): Payer: Self-pay | Admitting: Orthopedic Surgery

## 2015-02-27 LAB — CBC
HCT: 37.7 % — ABNORMAL LOW (ref 39.0–52.0)
Hemoglobin: 12.2 g/dL — ABNORMAL LOW (ref 13.0–17.0)
MCH: 27.2 pg (ref 26.0–34.0)
MCHC: 32.4 g/dL (ref 30.0–36.0)
MCV: 84.2 fL (ref 78.0–100.0)
PLATELETS: 320 10*3/uL (ref 150–400)
RBC: 4.48 MIL/uL (ref 4.22–5.81)
RDW: 14.2 % (ref 11.5–15.5)
WBC: 10.5 10*3/uL (ref 4.0–10.5)

## 2015-02-27 LAB — BASIC METABOLIC PANEL
Anion gap: 8 (ref 5–15)
BUN: 5 mg/dL — AB (ref 6–20)
CO2: 29 mmol/L (ref 22–32)
CREATININE: 0.59 mg/dL — AB (ref 0.61–1.24)
Calcium: 8.7 mg/dL — ABNORMAL LOW (ref 8.9–10.3)
Chloride: 100 mmol/L — ABNORMAL LOW (ref 101–111)
GFR calc Af Amer: >60 mL/min (ref >60)
GFR calc non Af Amer: >60 mL/min (ref >60)
GLUCOSE: 145 mg/dL — AB (ref 65–99)
Potassium: 4.2 mmol/L (ref 3.5–5.1)
Sodium: 137 mmol/L (ref 135–145)

## 2015-02-27 NOTE — Discharge Instructions (Addendum)
Dr. Ollen Gross Total Joint Specialist [facility] [address] [place], [place] [address] [phone]   POSTERIOR TOTAL HIP REPLACEMENT POSTOPERATIVE DIRECTIONS  Hip Rehabilitation, Guidelines Following Surgery  The results of a hip operation are greatly improved after range of motion and muscle strengthening exercises. Follow all safety measures which are given to protect your hip. If any of these exercises cause increased pain or swelling in your joint, decrease the amount until you are comfortable again. Then slowly increase the exercises. Call your caregiver if you have problems or questions.   HOME CARE INSTRUCTIONS  Remove items at home which could result in a fall. This includes throw rugs or furniture in walking pathways.   ICE to the affected hip every three hours for 30 minutes at a time and then as needed for pain and swelling.  Continue to use ice on the hip for pain and swelling from surgery. You may notice swelling that will progress down to the foot and ankle.  This is normal after surgery.  Elevate the leg when you are not up walking on it.    Continue to use the breathing machine which will help keep your temperature down.  It is common for your temperature to cycle up and down following surgery, especially at night when you are not up moving around and exerting yourself.  The breathing machine keeps your lungs expanded and your temperature down.  DIET You may resume your previous home diet once your are discharged from the hospital.  DRESSING / WOUND CARE / SHOWERING You may shower 3 days after surgery, but keep the wounds dry during showering.  You may use an occlusive plastic wrap (Press'n Seal for example), NO SOAKING/SUBMERGING IN THE BATHTUB.  If the bandage gets wet, change with a clean dry gauze.  If the incision gets wet, pat the wound dry with a clean towel. You may start showering once you are discharged home but do not submerge  the incision under water. Just pat the incision dry and apply a dry gauze dressing on daily. Change the surgical dressing daily and reapply a dry dressing each time.    ACTIVITY Walk with your walker as instructed. Use walker as long as suggested by your caregivers. Avoid periods of inactivity such as sitting longer than an hour when not asleep. This helps prevent blood clots.  You may resume a sexual relationship in one month or when given the OK by your doctor.  You may return to work once you are cleared by your doctor.  Do not drive a car for 6 weeks or until released by you surgeon.  Do not drive while taking narcotics.  WEIGHT BEARING Partial weight bearing with assist device as directed.  25 % Partial Weight Bearing to Left Leg  POSTOPERATIVE CONSTIPATION PROTOCOL Constipation - defined medically as fewer than three stools per week and severe constipation as less than one stool per week.  One of the most common issues patients have following surgery is constipation.  Even if you have a regular bowel pattern at home, your normal regimen is likely to be disrupted due to multiple reasons following surgery.  Combination of anesthesia, postoperative narcotics, change in appetite and fluid intake all can affect your bowels.  In order to avoid complications following surgery, here are some recommendations in order to help you during your recovery period.  Colace (docusate) - Pick up an over-the-counter form of Colace or another stool softener and take twice a  day as long as you are requiring postoperative pain medications.  Take with a full [place]man of water daily.  If you experience loose stools or diarrhea, hold the colace until you stool forms back up.  If your symptoms do not get better within 1 week or if they get worse, check with your doctor.  Dulcolax (bisacodyl) - Pick up over-the-counter and take as directed by the product packaging as needed to assist with the movement of your bowels.   Take with a full [place] of water.  Use this product as needed if not relieved by Colace only.   MiraLax (polyethylene glycol) - Pick up over-the-counter to have on hand.  MiraLax is a solution that will increase the amount of water in your bowels to assist with bowel movements.  Take as directed and can mix with a Oquin of water, juice, soda, coffee, or tea.  Take if you go more than two days without a movement. Do not use MiraLax more than once per day. Call your doctor if you are still constipated or irregular after using this medication for 7 days in a row.  If you continue to have problems with postoperative constipation, please contact the office for further assistance and recommendations.  If you experience "the worst abdominal pain ever" or develop nausea or vomiting, please contact the office immediatly for further recommendations for treatment.  ITCHING  If you experience itching with your medications, try taking only a single pain pill, or even half a pain pill at a time.  You can also use Benadryl over the counter for itching or also to help with sleep.   TED HOSE STOCKINGS Wear the elastic stockings on both legs for three weeks following surgery during the day but you may remove then at night for sleeping.  MEDICATIONS See your medication summary on the After Visit Summary that the nursing staff will review with you prior to discharge.  You may have some home medications which will be placed on hold until you complete the course of blood thinner medication.  It is important for you to complete the blood thinner medication as prescribed by your surgeon.  Continue your approved medications as instructed at time of discharge.  PRECAUTIONS If you experience chest pain or shortness of breath - call 911 immediately for transfer to the hospital emergency department.  If you develop a fever greater that 101 F, purulent drainage from wound, increased redness or drainage from wound, foul odor  from the wound/dressing, or calf pain - CONTACT YOUR SURGEON.                                                   FOLLOW-UP APPOINTMENTS Make sure you keep all of your appointments after your operation with your surgeon and caregivers. You should call the office at the above phone number and make an appointment for approximately two weeks after the date of your surgery or on the date instructed by your surgeon outlined in the "After Visit Summary".  RANGE OF MOTION AND STRENGTHENING EXERCISES  These exercises are designed to help you keep full movement of your hip joint. Follow your caregiver's or physical therapist's instructions. Perform all exercises about fifteen times, three times per day or as directed. Exercise both hips, even if you have had only one joint replacement. These exercises can be done  on a training (exercise) mat, on the floor, on a table or on a bed. Use whatever works the best and is most comfortable for you. Use music or television while you are exercising so that the exercises are a pleasant break in your day. This will make your life better with the exercises acting as a break in routine you can look forward to.  Lying on your back, slowly slide your foot toward your buttocks, raising your knee up off the floor. Then slowly slide your foot back down until your leg is straight again.  Lying on your back spread your legs as far apart as you can without causing discomfort.  Lying on your side, raise your upper leg and foot straight up from the floor as far as is comfortable. Slowly lower the leg and repeat.  Lying on your back, tighten up the muscle in the front of your thigh (quadriceps muscles). You can do this by keeping your leg straight and trying to raise your heel off the floor. This helps strengthen the largest muscle supporting your knee.  Lying on your back, tighten up the muscles of your buttocks both with the legs straight and with the knee bent at a comfortable angle while  keeping your heel on the floor.      IF YOU ARE TRANSFERRED TO A SKILLED REHAB FACILITY If the patient is transferred to a skilled rehab facility following release from the hospital, a list of the current medications will be sent to the facility for the patient to continue.  When discharged from the skilled rehab facility, please have the facility set up the patient's Home Health Physical Therapy prior to being released. Also, the skilled facility will be responsible for providing the patient with their medications at time of release from the facility to include their pain medication, the muscle relaxants, and their blood thinner medication. If the patient is still at the rehab facility at time of the two week follow up appointment, the skilled rehab facility will also need to assist the patient in arranging follow up appointment in our office and any transportation needs.  MAKE SURE YOU:  Understand these instructions.  Get help right away if you are not doing well or get worse.    Pick up stool softner and laxative for home use following surgery while on pain medications. Do not submerge incision under water. Please use good hand washing techniques while changing dressing each day. May shower starting three days after surgery. Please use a clean towel to pat the incision dry following showers. Continue to use ice for pain and swelling after surgery. Do not use any lotions or creams on the incision until instructed by your surgeon.  Take Xarelto for two and a half more weeks, then discontinue Xarelto. Once the patient has completed the blood thinner regimen, then take a Baby 81 mg Aspirin daily for three more weeks.   Information on my medicine - XARELTO (Rivaroxaban)  This medication education was reviewed with me or my healthcare representative as part of my discharge preparation.  The pharmacist that spoke with me during my hospital stay was:  Otho Bellows, [employer]  Why was Xarelto  prescribed for you? Xarelto was prescribed for you to reduce the risk of blood clots forming after orthopedic surgery. The medical term for these abnormal blood clots is venous thromboembolism (VTE).  What do you need to know about xarelto ? Take your Xarelto ONCE DAILY at the same time every day.  You may take it either with or without food.  If you have difficulty swallowing the tablet whole, you may crush it and mix in applesauce just prior to taking your dose.  Take Xarelto exactly as prescribed by your doctor and DO NOT stop taking Xarelto without talking to the doctor who prescribed the medication.  Stopping without other VTE prevention medication to take the place of Xarelto may increase your risk of developing a clot.  After discharge, you should have regular check-up appointments with your healthcare provider that is prescribing your Xarelto.    What do you do if you miss a dose? If you miss a dose, take it as soon as you remember on the same day then continue your regularly scheduled once daily regimen the next day. Do not take two doses of Xarelto on the same day.   Important Safety Information A possible side effect of Xarelto is bleeding. You should call your healthcare provider right away if you experience any of the following: ? Bleeding from an injury or your nose that does not stop. ? Unusual colored urine (red or dark brown) or unusual colored stools (red or black). ? Unusual bruising for unknown reasons. ? A serious fall or if you hit your head (even if there is no bleeding).  Some medicines may interact with Xarelto and might increase your risk of bleeding while on Xarelto. To help avoid this, consult your healthcare provider or pharmacist prior to using any new prescription or non-prescription medications, including herbals, vitamins, non-steroidal anti-inflammatory drugs (NSAIDs) and supplements.  This website has more information on Xarelto:  [email or web address].

## 2015-02-27 NOTE — Progress Notes (Signed)
Physical Therapy Treatment Patient Details Name: Chris Drake MRN: [id number] DOB: 1956/06/27 Today's Date: 02/27/2015    History of Present Illness L THR - posterior     PT Comments    Pt with increased confidence in ability this pm and noted improvement in performance of mobility tasks  Follow Up Recommendations  SNF     Equipment Recommendations  None recommended by PT    Recommendations for Other Services OT consult     Precautions / Restrictions Precautions Precautions: Posterior Hip;Fall Precaution Booklet Issued: Yes (comment) Precaution Comments: pt recalls 2/3 THP without cues Restrictions Weight Bearing Restrictions: Yes LLE Weight Bearing: Partial weight bearing LLE Partial Weight Bearing Percentage or Pounds: 25%    Mobility  Bed Mobility Overal bed mobility: Needs Assistance;+2 for physical assistance Bed Mobility: Sit to Supine     Supine to sit: Max assist;+2 for physical assistance Sit to supine: Mod assist;+2 for physical assistance;+2 for safety/equipment   General bed mobility comments: cues for sequence, use of R LE to self assist and adherence to THP.  PHysical assist to manage LEs and to control trunk  Transfers Overall transfer level: Needs assistance Equipment used: Rolling walker (2 wheeled) Transfers: Sit to/from Stand Sit to Stand: +2 physical assistance;Mod assist         General transfer comment: cues for hip precautions and hand placement. assist to rise and steady and manage L LE to adhere to precautions.   Ambulation/Gait Ambulation/Gait assistance: Mod assist;+2 physical assistance;+2 safety/equipment Ambulation Distance (Feet): 12 Feet Assistive device: Rolling walker (2 wheeled) Gait Pattern/deviations: Step-to pattern;Decreased step length - right;Decreased step length - left;Shuffle;Trunk flexed Gait velocity: decr   General Gait Details: cues for sequence, posture, position from RW, PWB and increased UE  WB   Stairs            Wheelchair Mobility    Modified Rankin (Stroke Patients Only)       Balance                                    Cognition Arousal/Alertness: Awake/alert Behavior During Therapy: WFL for tasks assessed/performed Overall Cognitive Status: Within Functional Limits for tasks assessed                      Exercises Total Joint Exercises Ankle Circles/Pumps: AROM;Both;15 reps;Supine Quad Sets: AROM;Both;10 reps;Supine Heel Slides: AAROM;Left;10 reps;Supine Hip ABduction/ADduction: AAROM;Left;10 reps;Supine    General Comments        Pertinent Vitals/Pain Pain Assessment: 0-10 Pain Score: 7  Pain Location: L hip Pain Descriptors / Indicators: Aching;Sore Pain Intervention(s): Monitored during session;Limited activity within patient's tolerance;Ice applied;Patient requesting pain meds-RN notified;Premedicated before session    Home Living Family/patient expects to be discharged to:: Private residence Living Arrangements: Parent Available Help at Discharge: Family Type of Home: House Home Access: Stairs to enter   Home Layout: One level Home Equipment: Environmental consultant - 2 wheels;Wheelchair - [employer];Adaptive equipment Additional Comments: Pt is home with elderly mother in home "that is so full I can't move in"    Prior Function Level of Independence: Needs assistance  Gait / Transfers Assistance Needed: short distances with walker but difficult ADL's / Homemaking Assistance Needed: had assist with bathing, dressing for LB     PT Goals (current goals can now be found in the care plan section) Acute Rehab PT Goals Patient Stated Goal: Walk without pain PT  Goal Formulation: With patient Time For Goal Achievement: 03/06/15 Potential to Achieve Goals: Good Progress towards PT goals: Progressing toward goals    Frequency  7X/week    PT Plan Current plan remains appropriate    Co-evaluation PT/OT/SLP  Co-Evaluation/Treatment: Yes Reason for Co-Treatment: For patient/therapist safety PT goals addressed during session: Mobility/safety with mobility OT goals addressed during session: ADL's and self-care     End of Session Equipment Utilized During Treatment: Gait belt Activity Tolerance: Patient tolerated treatment well;Patient limited by pain;Patient limited by fatigue Patient left: in bed;with call bell/phone within reach;with family/visitor present     Time: 1610-9604 PT Time Calculation (min) (ACUTE ONLY): 33 min  Charges:  $Gait Training: 8-22 mins $Therapeutic Exercise: 8-22 mins                    G Codes:      Faith Patricelli Mar 27, 2015, 4:19 PM

## 2015-02-27 NOTE — Progress Notes (Signed)
   Subjective: 1 Day Post-Op Procedure(s) (LRB): TOTAL LEFT HIP ARTHROPLASTY (POSTERIOR APPROACH)  (Left) Patient reports pain as mild and moderate.   Patient seen in rounds with Dr. Lequita Halt.  Family in room. Patient is well, but has had some minor complaints of pain in the hip, requiring pain medications Walked about 8 feet earlier today. Plan is to go Skilled nursing facility after hospital stay.  Objective: Vital signs in last 24 hours: Temp:  [97.7 F (36.5 C)-98.9 F (37.2 C)] 98 F (36.7 C) (06/22 1338) Pulse Rate:  [83-126] 83 (06/22 1338) Resp:  [13-25] 18 (06/22 1338) BP: (103-175)/(60-113) 113/62 mmHg (06/22 1338) SpO2:  [96 %-100 %] 97 % (06/22 1338)  Intake/Output from previous day:  Intake/Output Summary (Last 24 hours) at 02/27/15 1615 Last data filed at 02/27/15 1400  Gross per 24 hour  Intake 4140.01 ml  Output   2950 ml  Net 1190.01 ml    Intake/Output this shift: Total I/O In: 1018.3 [P.O.:240; I.V.:778.3] Out: -   Labs:  Recent Labs  02/27/15 0405  HGB 12.2*    Recent Labs  02/27/15 0405  WBC 10.5  RBC 4.48  HCT 37.7*  PLT 320    Recent Labs  02/27/15 0405  NA 137  K 4.2  CL 100*  CO2 29  BUN 5*  CREATININE 0.59*  GLUCOSE 145*  CALCIUM 8.7*   No results for input(s): LABPT, INR in the last 72 hours.  EXAM General - Patient is Alert and Appropriate Extremity - Neurovascular intact Sensation intact distally Dressing - dressing C/D/I Motor Function - intact, moving foot and toes well on exam.  Hemovac pulled without difficulty.  Past Medical History  Diagnosis Date  . Hypertension   . Arthritis   . Gout   . Slow urinary stream   . 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   Weight as of this encounter: 83.462 kg (184 lb). Advance diet Up with therapy Discharge to SNF- probably Friday  DVT Prophylaxis - Xarelto Partial Weight Bearing 25 % to the left Leg for 6 Weeks D/C Knee Immobilizer Hemovac Pulled Begin Therapy Hip Preacutions  Avel Peace, PA-C Orthopaedic Surgery 02/27/2015, 4:15 PM

## 2015-02-27 NOTE — Progress Notes (Signed)
Utilization review completed.  

## 2015-02-27 NOTE — Plan of Care (Signed)
Problem: Consults Goal: Total Joint Replacement Patient Education See Patient Education Module for education specifics. Outcome: Progressing s/p Lt THA Goal: Diagnosis- Total Joint Replacement Outcome: Completed/Met Date Met:  02/27/15 Primary Total Hip

## 2015-02-27 NOTE — Evaluation (Signed)
Physical Therapy Evaluation Patient Details Name: Chris Drake MRN: [id number] DOB: 1956-03-28 Today's Date: 02/27/2015   History of Present Illness  L THR - posterior   Clinical Impression  Pt s/p L THR presents with decreased L LE strength/ROM, post op pain, PWB status, and posterior THP limiting functional mobility.  Pt would greatly benefit from follow up rehab at SNF level to maximize IND and safety prior to return home with assist of elderly mother.    Follow Up Recommendations SNF    Equipment Recommendations  None recommended by PT    Recommendations for Other Services OT consult     Precautions / Restrictions Precautions Precautions: Posterior Hip;Fall Precaution Booklet Issued: Yes (comment) Precaution Comments: Reviewed all precautions with pt/family Restrictions Weight Bearing Restrictions: Yes LLE Weight Bearing: Partial weight bearing LLE Partial Weight Bearing Percentage or Pounds: 25%      Mobility  Bed Mobility Overal bed mobility: Needs Assistance;+2 for physical assistance Bed Mobility: Supine to Sit     Supine to sit: Max assist;+2 for physical assistance     General bed mobility comments: use of bed pad to scoot around to EOB. Assist for trunk to upright and balance support as well as for LEs over to EOB. Cues for hip precautions.   Transfers Overall transfer level: Needs assistance Equipment used: Rolling walker (2 wheeled) Transfers: Sit to/from Stand Sit to Stand: +2 physical assistance;Mod assist;From elevated surface         General transfer comment: cues for hip precautions and hand placement. assist to rise and steady and manage L LE to adhere to precautions.   Ambulation/Gait Ambulation/Gait assistance: Mod assist;+2 physical assistance;+2 safety/equipment Ambulation Distance (Feet): 8 Feet Assistive device: Rolling walker (2 wheeled) Gait Pattern/deviations: Step-to pattern;Decreased step length - right;Decreased step length -  left;Shuffle;Trunk flexed Gait velocity: decr   General Gait Details: cues for sequence, posture, position from RW, PWB and increased UE WB  Stairs            Wheelchair Mobility    Modified Rankin (Stroke Patients Only)       Balance                                             Pertinent Vitals/Pain Pain Assessment: 0-10 Pain Score: 5  Pain Location: L hip Pain Descriptors / Indicators: Aching;Sore;Grimacing Pain Intervention(s): Limited activity within patient's tolerance;Monitored during session;Premedicated before session;Ice applied    Home Living Family/patient expects to be discharged to:: Private residence Living Arrangements: Parent Available Help at Discharge: Family Type of Home: House Home Access: Stairs to enter   Secretary/administrator of Steps: 1 + 1 Home Layout: One level Home Equipment: Environmental consultant - 2 wheels;Wheelchair - [employer];Adaptive equipment Additional Comments: Pt is home with elderly mother in home "that is so full I can't move in"    Prior Function Level of Independence: Needs assistance   Gait / Transfers Assistance Needed: short distances with walker but difficult  ADL's / Homemaking Assistance Needed: had assist with bathing, dressing for LB        Hand Dominance        Extremity/Trunk Assessment   Upper Extremity Assessment: Defer to OT evaluation RUE Deficits / Details: 4+/5 shoulder strength, grip strength fair; pt states history of L rotator cuff issue but never repaired.         Lower Extremity Assessment:  LLE deficits/detail   LLE Deficits / Details: Hip strength 2-/5 with AAROM at hip to 70 flex and 15 abd  Cervical / Trunk Assessment: Kyphotic  Communication   Communication: No difficulties  Cognition Arousal/Alertness: Awake/alert Behavior During Therapy: WFL for tasks assessed/performed Overall Cognitive Status: Within Functional Limits for tasks assessed                       General Comments General comments (skin integrity, edema, etc.): difficulty maintaining upright at EOB due to pain. Leaning heavily to the R and dependent on UEs to support self.     Exercises Total Joint Exercises Ankle Circles/Pumps: AROM;Both;15 reps;Supine Quad Sets: AROM;Both;10 reps;Supine Heel Slides: AAROM;Left;10 reps;Supine Hip ABduction/ADduction: AAROM;Left;10 reps;Supine      Assessment/Plan    PT Assessment Patient needs continued PT services  PT Diagnosis Difficulty walking   PT Problem List Decreased strength;Decreased range of motion;Decreased activity tolerance;Decreased mobility;Decreased knowledge of use of DME;Decreased knowledge of precautions  PT Treatment Interventions DME instruction;Gait training;Functional mobility training;Therapeutic activities;Therapeutic exercise;Patient/family education   PT Goals (Current goals can be found in the Care Plan section) Acute Rehab PT Goals Patient Stated Goal: Walk without pain PT Goal Formulation: With patient Time For Goal Achievement: 03/06/15 Potential to Achieve Goals: Good    Frequency 7X/week   Barriers to discharge        Co-evaluation PT/OT/SLP Co-Evaluation/Treatment: Yes Reason for Co-Treatment: For patient/therapist safety PT goals addressed during session: Mobility/safety with mobility OT goals addressed during session: ADL's and self-care       End of Session Equipment Utilized During Treatment: Gait belt Activity Tolerance: Patient tolerated treatment well;Patient limited by pain;Patient limited by fatigue Patient left: in chair;with call bell/phone within reach;with family/visitor present Nurse Communication: Mobility status         Time: 4680-3212 PT Time Calculation (min) (ACUTE ONLY): 46 min   Charges:   PT Evaluation $Initial PT Evaluation Tier I: 1 Procedure PT Treatments $Therapeutic Exercise: 8-22 mins   PT G Codes:        Jemila Camille 18-Mar-2015, 4:10  PM

## 2015-02-27 NOTE — Op Note (Signed)
NAMESHAFIQ, LARCH NO.:  [id number]  MEDICAL RECORD NO.:  [id number]  LOCATION:  1614                         FACILITY:  [facility]  PHYSICIAN:  Ollen Gross, M.D.    DATE OF BIRTH:  08/22/1956  DATE OF PROCEDURE:  02/26/2015 DATE OF DISCHARGE:                              OPERATIVE REPORT   PREOPERATIVE DIAGNOSIS:  Malunion/nonunion, left intertrochanteric femur fracture  POSTOPERATIVE DIAGNOSIS:  Malunion/nonunion, left intertrochanteric femur fracture.  PROCEDURE:  Left total hip arthroplasty.  SURGEON:  Ollen Gross, M.D.  ASSISTANT:  Alexzandrew L. Perkins, PA-C.  ANESTHESIA:  General.  ESTIMATED BLOOD LOSS:  350.  DRAINS:  Hemovac x1.  COMPLICATIONS:  None.  CONDITION:  Stable to recovery.  BRIEF CLINICAL NOTE:  Mr. Modica is a 59 year old male who had a fall with left hip pain approximately 2-1/2 months ago.  I saw him in the office 2 weeks ago, noted that he had a shortened externally rotated limb.  His radiographs taken that day showed a varus femoral neck with an intertrochanteric femur fracture, which was partially healed.  An MRI, which he brought with him that showed the same thing.  Given that it had already healed in a shortened varus position with retroversion, I felt there was no way we were going to be able to take that down and fix it with internal fixation.  We discussed treatment options and elected for a total hip arthroplasty.  PROCEDURE IN DETAIL:  After successful administration of general anesthetic, the patient was placed in a right lateral decubitus position with the left side up and held with the hip positioner.  The left lower extremity was isolated from his perineum with plastic drapes and prepped and draped in the usual sterile fashion.  Posterolateral incision was made with a 10 blade through subcutaneous tissue to the fascia lata, which was incised in line with the skin incision.  Sciatic nerve was palpated and  protected and short rotators and capsule isolated off the femur.  He had a significant deformity of retroversion and external rotation of his greater trochanter.  I dislocated the hip, and then had to cut the femoral head first before I can gain access to the femoral neck.  Once I gained access to the femoral neck, I was able to cut it down to his normal anatomy, which was just above the lesser trochanter. We then passed a starting reamer to the femoral canal and thoroughly irrigated the canal.  I used the lateral osteotome to get out lateral to the greater trochanter for entry point into the canal.  We then did the reamers for the AML stem getting up to 17.5 mm and placed an 18 mm stem. I did broach him with a small stature stem as the large stature stem was too large for his proximal femur.  We broached up to a size 18 in about 20 degrees of anteversion with excellent purchase in the femoral neck and also excellent purchase distally.  I then removed the broach.  We then retracted the femur anteriorly to gain acetabular exposure.  Acetabular exposure was obtained and retractor was placed.  I removed the labrum.  We started reaming at  47 mm and then reamed to 49 mm for placement of a 50 mm Pinnacle acetabular shell.  He was placed in anatomic position with excellent purchase.  A 32 mm neutral +4 marathon liner was then placed.  We again placed the 18 broach, which was a small stature verge,  this is in 20 degrees of anteversion.  Femoral neck was placed with a 32+ 1 head.  Reduction was performed with outstanding stability.  Full extension, full external rotation, 70 degrees flexion, 40 degrees adduction, 90 degrees internal rotation, and 90 degrees of flexion and 70 degrees of internal rotation.  By placing the left leg on top of the right, we had restored his leg lengths to equal.  The hip was then dislocated and trials removed.  The permanent 18 AML small stature stem was impacted  into the femur and about 20 degrees of anteversion.  I was able to get it about 5 mm below where the trial was [place] to use a 32+ 5 ceramic femoral head.  The head was placed.  The hip was reduced with same stability parameters and with equalized leg lengths.  The wound was then copiously irrigated with saline solution and short rotators and capsule, reattached to the femur through drill holes with Ethibond suture.  Fascia lata was closed over Hemovac drain with running #1 V-Loc suture.  A 30 mL of 0.25% Marcaine with epi was injected into the subcutaneous tissues and fascia lata.  Subcu was closed with interrupted #1 and 2-0 Vicryl and subcuticular running 4-0 Monocryl.  Incisions cleaned and dried, Steri-Strips and a bulky sterile dressing applied. Drains hooked to suction.  He was awakened and transported to recovery in stable condition.  Note that a surgical assistant was a medical necessity for this complex procedure.  Assistant was necessary for retraction of vital neurovascular structures and proper exposure of the distorted anatomy in order to allow for anatomic placement of the new prosthesis and for ensuring a stable construct.     Ollen Gross, M.D.     FA/MEDQ  D:  02/26/2015  T:  02/27/2015  Job:  [id number]

## 2015-02-27 NOTE — Clinical Social Work Note (Signed)
Clinical Social Work Assessment  Patient Details  Name: Chris Drake MRN: [id number] Date of Birth: 1956-02-12  Date of referral:  02/27/15               Reason for consult:  Discharge Planning, Facility Placement                Permission sought to share information with:    Permission granted to share information::     Name::        Agency::     Relationship::     Contact Information:     Housing/Transportation Living arrangements for the past 2 months:  Single Family Home Source of Information:  Patient Patient Interpreter Needed:  None Criminal Activity/Legal Involvement Pertinent to Current Situation/Hospitalization:  No - Comment as needed Significant Relationships:  Parents Lives with:  Parents Do you feel safe going back to the place where you live?   (ST Rehab needed.) Need for family participation in patient care:     Care giving concerns:  Pt's care cannot be managed at home following hospital d/c.   Social Worker assessment / plan:  Pt hospitalized on 02/26/15 for pre planned left total hip arthroplasty. Pt admitted on 6/21 with a non union left intertrochanteric femur fx. CSW met with pt / mother to assist with d/c planning. PT / OT have recommended ST Rehab at dc. Pt is in agreement with this plan. SNF search initiated. Bed offer are pending.  Pt has [employer] which requires prior authorization. CSW will assist with authorization process. Employment status:  Therapist, music:  Managed Care PT Recommendations:  [place] / Referral to community resources:  [place]  Patient/Family's Response to care:  Pt / mother feel rehab is needed.  Patient/Family's Understanding of and Emotional Response to Diagnosis, Current Treatment, and Prognosis:  Pt is motivated to work with therapy. Both pt / mother feel going home from hospital will be unsafe. Pt is hopeful he can be placed close to home.  Emotional  Assessment Appearance:  Appears stated age Attitude/Demeanor/Rapport:  Other (cooperative) Affect (typically observed):  Appropriate, Calm Orientation:  Oriented to Self, Oriented to Place, Oriented to  Time, Oriented to Situation Alcohol / Substance use:  Not Applicable Psych involvement (Current and /or in the community):  No (Comment)  Discharge Needs  Concerns to be addressed:  No discharge needs identified Readmission within the last 30 days:  No Current discharge risk:  None Barriers to Discharge:  No Barriers Identified   Mateja Dier, Randall An, LCSW 02/27/2015, 2:31 PM

## 2015-02-27 NOTE — Clinical Social Work Placement (Signed)
   CLINICAL SOCIAL WORK PLACEMENT  NOTE  Date:  02/27/2015  Patient Details  Name: Chris Drake MRN: [id number] Date of Birth: [birth year]  Clinical Social Work is seeking post-discharge placement for this patient at the Skilled  Nursing Facility level of care (*CSW will initial, date and re-position this form in  chart as items are completed):  Yes   Patient/family provided with [facility] Clinical Social Work Department's list of facilities offering this level of care within the geographic area requested by the patient (or if unable, by the patient's family).  Yes   Patient/family informed of their freedom to choose among providers that offer the needed level of care, that participate in Medicare, Medicaid or managed care program needed by the patient, have an available bed and are willing to accept the patient.  Yes   Patient/family informed of [facility]'s ownership interest in [facility] and [facility], as well as of the fact that they are under no obligation to receive care at these facilities.  PASRR submitted to EDS on       PASRR number received on       Existing PASRR number confirmed on       FL2 transmitted to all facilities in geographic area requested by pt/family on 02/27/15     FL2 transmitted to all facilities within larger geographic area on       Patient informed that his/her managed care company has contracts with or will negotiate with certain facilities, including the following:            Patient/family informed of bed offers received.  Patient chooses bed at       Physician recommends and patient chooses bed at      Patient to be transferred to   on  .  Patient to be transferred to facility by       Patient family notified on   of transfer.  Name of family member notified:        PHYSICIAN       Additional Comment:    _______________________________________________ Royetta Asal, LCSW 02/27/2015, 2:43 PM

## 2015-02-27 NOTE — Evaluation (Signed)
Occupational Therapy Evaluation Patient Details Name: Chris Drake MRN: [id number] DOB: August 25, 1956 Today's Date: 02/27/2015    History of Present Illness Pt admitted for L THA posterior approach   Clinical Impression   Pt requiring +2 mod to +2 total assist with ADLs currently. Pt up to take a few steps with walker in room and pain 5/10. Discussed SNF option depending on how pt progresses while on acute as he lives with his mother and currently requiring the +2 assist. Will follow on acute to progress ADL independence and reinforce hip precautions.     Follow Up Recommendations  SNF;Supervision/Assistance - 24 hour;Other (comment);Home health OT (pt may benefit from SNF unless able to progress to d/c home)    Equipment Recommendations  None recommended by OT    Recommendations for Other Services       Precautions / Restrictions Precautions Precautions: Posterior Hip;Fall Precaution Booklet Issued: Yes (comment) Precaution Comments: Reviewed all precautions with pt/family Restrictions Weight Bearing Restrictions: Yes LLE Weight Bearing: Partial weight bearing LLE Partial Weight Bearing Percentage or Pounds: 25      Mobility Bed Mobility Overal bed mobility: Needs Assistance;+2 for physical assistance Bed Mobility: Supine to Sit     Supine to sit: Max assist;+2 for physical assistance     General bed mobility comments: use of bed pad to scoot around to EOB. Assist for trunk to upright and balance support as well as for LEs over to EOB. Cues for hip precautions.   Transfers Overall transfer level: Needs assistance Equipment used: Rolling walker (2 wheeled) Transfers: Sit to/from Stand Sit to Stand: +2 physical assistance;Mod assist;From elevated surface         General transfer comment: cues for hip precautions and hand placement. assist to rise and steady and manage L LE to adhere to precautions.     Balance                                             ADL Overall ADL's : Needs assistance/impaired Eating/Feeding: Independent;Sitting   Grooming: Wash/dry hands;Set up;Sitting   Upper Body Bathing: Sitting;Maximal assistance Upper Body Bathing Details (indicate cue type and reason): supported sitting setup/supervision.  Lower Body Bathing: Sit to/from stand;+2 for physical assistance;Total assistance   Upper Body Dressing : Maximal assistance;Sitting   Lower Body Dressing: +2 for physical assistance;Total assistance;Sit to/from stand--unable to free UEs currently to manage clothing in standing. Also has hip precautions.   Toilet Transfer: +2 for physical assistance;Moderate assistance;RW;Ambulation   Toileting- Clothing Manipulation and Hygiene: +2 for physical assistance;Total assistance;Sit to/from stand         General ADL Comments: Pt with much difficulty with maintaining upright sitting at EOB due to pain in L hip. Pt leaning heavily to the R to keep pressure off L hip but then having difficulty freeing Ues to use in functional ADL. Currently requires max assist to don gown or wash UB due to needing UEs to hold self  at EOB. Discussed briefly option of AE for LB self care as pt does own a reacher. discussed SNf option also as pt only has his mother to assist at d/c.      Vision     Perception     Praxis      Pertinent Vitals/Pain Pain Assessment: 0-10 Pain Score: 5  Pain Location: L hip Pain Descriptors / Indicators: Grimacing;Aching Pain Intervention(s):  Repositioned;Ice applied     Hand Dominance     Extremity/Trunk Assessment Upper Extremity Assessment Upper Extremity Assessment: RUE deficits/detail RUE Deficits / Details: 4+/5 shoulder strength, grip strength fair; pt states history of L rotator cuff issue but never repaired.           Communication Communication Communication: No difficulties   Cognition Arousal/Alertness: Awake/alert Behavior During Therapy: WFL for tasks  assessed/performed Overall Cognitive Status: Within Functional Limits for tasks assessed                     General Comments       Exercises       Shoulder Instructions      Home Living Family/patient expects to be discharged to:: Private residence Living Arrangements: Parent   Type of Home: House Home Access: Stairs to enter Secretary/administrator of Steps: 1 + 1   Home Layout: One level     Bathroom Shower/Tub: Tub/shower unit         Home Equipment: Environmental consultant - 2 wheels;Wheelchair - [employer];Adaptive equipment Adaptive Equipment: Reacher        Prior Functioning/Environment Level of Independence: Needs assistance  Gait / Transfers Assistance Needed: short distances with walker but difficult ADL's / Homemaking Assistance Needed: had assist with bathing, dressing for LB        OT Diagnosis: Generalized weakness;Acute pain   OT Problem List: Decreased strength;Decreased knowledge of use of DME or AE;Decreased knowledge of precautions;Pain   OT Treatment/Interventions: Self-care/ADL training;Patient/family education;Therapeutic activities;DME and/or AE instruction    OT Goals(Current goals can be found in the care plan section) Acute Rehab OT Goals Patient Stated Goal: wants to return to more independence.  OT Goal Formulation: With patient Time For Goal Achievement: 03/06/15 Potential to Achieve Goals: Good  OT Frequency: Min 2X/week   Barriers to D/C:            Co-evaluation PT/OT/SLP Co-Evaluation/Treatment: Yes Reason for Co-Treatment: For patient/therapist safety   OT goals addressed during session: ADL's and self-care;Proper use of Adaptive equipment and DME      End of Session Equipment Utilized During Treatment: Gait belt;Rolling walker  Activity Tolerance: Patient limited by pain Patient left: in chair;with call bell/phone within reach   Time: 1044-1130 OT Time Calculation (min): 46 min Charges:  OT General  Charges $OT Visit: 1 Procedure OT Evaluation $Initial OT Evaluation Tier I: 1 Procedure G-Codes:    Lennox Laity  [phone] 02/27/2015, 12:16 PM

## 2015-02-28 ENCOUNTER — Inpatient Hospital Stay (HOSPITAL_COMMUNITY): Payer: BLUE CROSS/BLUE SHIELD

## 2015-02-28 LAB — CBC
HCT: 32.5 % — ABNORMAL LOW (ref 39.0–52.0)
Hemoglobin: 10.7 g/dL — ABNORMAL LOW (ref 13.0–17.0)
MCH: 28 pg (ref 26.0–34.0)
MCHC: 32.9 g/dL (ref 30.0–36.0)
MCV: 85.1 fL (ref 78.0–100.0)
Platelets: 310 10*3/uL (ref 150–400)
RBC: 3.82 MIL/uL — ABNORMAL LOW (ref 4.22–5.81)
RDW: 14.7 % (ref 11.5–15.5)
WBC: 14.5 10*3/uL — ABNORMAL HIGH (ref 4.0–10.5)

## 2015-02-28 LAB — BASIC METABOLIC PANEL
Anion gap: 6 (ref 5–15)
BUN: 7 mg/dL (ref 6–20)
CO2: 29 mmol/L (ref 22–32)
Calcium: 8.4 mg/dL — ABNORMAL LOW (ref 8.9–10.3)
Chloride: 103 mmol/L (ref 101–111)
Creatinine, Ser: 0.52 mg/dL — ABNORMAL LOW (ref 0.61–1.24)
GFR calc Af Amer: >60 mL/min (ref >60)
GFR calc non Af Amer: >60 mL/min (ref >60)
Glucose, Bld: 108 mg/dL — ABNORMAL HIGH (ref 65–99)
Potassium: 4 mmol/L (ref 3.5–5.1)
Sodium: 138 mmol/L (ref 135–145)

## 2015-02-28 NOTE — Progress Notes (Signed)
   Subjective: 2 Days Post-Op Procedure(s) (LRB): TOTAL LEFT HIP ARTHROPLASTY (POSTERIOR APPROACH)  (Left) Patient reports pain as mild and moderate.   Patient seen in rounds by Dr. Lequita Halt. Patient is well, but has had some minor complaints of pain in the hip, requiring pain medications We will resume therapy today.  Plan is to go Skilled nursing facility after hospital stay.  Objective: Vital signs in last 24 hours: Temp:  [97.8 F (36.6 C)-98.3 F (36.8 C)] 97.9 F (36.6 C) (06/23 0532) Pulse Rate:  [61-90] 61 (06/23 0532) Resp:  [15-18] 16 (06/23 0532) BP: (103-133)/(49-78) 133/78 mmHg (06/23 0532) SpO2:  [96 %-99 %] 97 % (06/23 0532)  Intake/Output from previous day:  Intake/Output Summary (Last 24 hours) at 02/28/15 0844 Last data filed at 02/28/15 0831  Gross per 24 hour  Intake 2250.68 ml  Output   1575 ml  Net 675.68 ml    Intake/Output this shift: Total I/O In: 240 [P.O.:240] Out: -   Labs:  Recent Labs  02/27/15 0405 02/28/15 0420  HGB 12.2* 10.7*    Recent Labs  02/27/15 0405 02/28/15 0420  WBC 10.5 14.5*  RBC 4.48 3.82*  HCT 37.7* 32.5*  PLT 320 310    Recent Labs  02/27/15 0405 02/28/15 0420  NA 137 138  K 4.2 4.0  CL 100* 103  CO2 29 29  BUN 5* 7  CREATININE 0.59* 0.52*  GLUCOSE 145* 108*  CALCIUM 8.7* 8.4*   No results for input(s): LABPT, INR in the last 72 hours.  EXAM General - Patient is Alert and Appropriate Extremity - Neurovascular intact Sensation intact distally Dorsiflexion/Plantar flexion intact Dressing - dressing C/D/I Motor Function - intact, moving foot and toes well on exam.    Past Medical History  Diagnosis Date  . Hypertension   . Arthritis   . Gout   . Slow urinary stream   . Femur fracture     Assessment/Plan: 2 Days Post-Op Procedure(s) (LRB): TOTAL LEFT HIP ARTHROPLASTY (POSTERIOR APPROACH)  (Left) Principal Problem:   Closed fracture of neck of femur with malunion Active Problems:   OA  (osteoarthritis) of hip  Estimated body mass index is 27.98 kg/(m^2) as calculated from the following:   Height as of this encounter: 5\' 8"  (1.727 m).   Weight as of this encounter: 83.462 kg (184 lb). Up with therapy Plan for discharge tomorrow Discharge to SNF  DVT Prophylaxis - Xarelto Partial Weight Bearing 25 % to the left Leg for 6 Weeks Continue Therapy Hip Preacutions  Avel Peace, PA-C Orthopaedic Surgery 02/28/2015, 8:44 AM

## 2015-02-28 NOTE — Care Management Note (Signed)
Case Management Note  Patient Details  Name: Chris Drake MRN: [id number] Date of Birth: Nov 08, 1955  Subjective/Objective:                   TOTAL LEFT HIP ARTHROPLASTY (POSTERIOR APPROACH) (Left) Action/Plan: Discharge planning  Expected Discharge Date:  03/01/15               Expected Discharge Plan:  Skilled Nursing Facility  In-House Referral:     Discharge planning Services  CM Consult  Post Acute Care Choice:    Choice offered to:     DME Arranged:    DME Agency:     HH Arranged:    HH Agency:     Status of Service:  Completed, signed off  Medicare Important Message Given:    Date Medicare IM Given:    Medicare IM give by:    Date Additional Medicare IM Given:    Additional Medicare Important Message give by:     If discussed at Long Length of Stay Meetings, dates discussed:    Additional Comments: CM notes pt to go to SNF for rehab; CSW arranging.  No other CM needs were communicated. Yves Dill, RN 02/28/2015, 9:56 AM

## 2015-02-28 NOTE — Progress Notes (Signed)
Physical Therapy Treatment Patient Details Name: Chris Drake MRN: [id number] DOB: Sep 01, 1956 Today's Date: 2015/03/01    History of Present Illness L THR - posterior     PT Comments    Pt very cooperative - session ltd to therex - pt needed in radiology.  Will return later am for OOB.  Follow Up Recommendations  SNF     Equipment Recommendations  None recommended by PT    Recommendations for Other Services OT consult     Precautions / Restrictions Precautions Precautions: Posterior Hip;Fall Precaution Booklet Issued: Yes (comment) Precaution Comments: pt recalls 1/3 THP without cues Restrictions Weight Bearing Restrictions: Yes LLE Weight Bearing: Partial weight bearing LLE Partial Weight Bearing Percentage or Pounds: 25%    Mobility  Bed Mobility               General bed mobility comments: OOB deferred - pt needed in Radiology  Transfers                    Ambulation/Gait                 Stairs            Wheelchair Mobility    Modified Rankin (Stroke Patients Only)       Balance                                    Cognition Arousal/Alertness: Awake/alert Behavior During Therapy: WFL for tasks assessed/performed Overall Cognitive Status: Within Functional Limits for tasks assessed                      Exercises Total Joint Exercises Ankle Circles/Pumps: AROM;Both;15 reps;Supine Quad Sets: AROM;Both;Supine;15 reps Heel Slides: AAROM;Left;Supine;20 reps Hip ABduction/ADduction: AAROM;Left;Supine;20 reps    General Comments        Pertinent Vitals/Pain Pain Assessment: 0-10 Pain Score: 8  Pain Location: L hip Pain Descriptors / Indicators: Aching;Sore Pain Intervention(s): Limited activity within patient's tolerance;Monitored during session;Premedicated before session;Ice applied    Home Living                      Prior Function            PT Goals (current goals can now be  found in the care plan section) Acute Rehab PT Goals Patient Stated Goal: Walk without pain PT Goal Formulation: With patient Time For Goal Achievement: 03/06/15 Potential to Achieve Goals: Good Progress towards PT goals: Progressing toward goals    Frequency  7X/week    PT Plan Current plan remains appropriate    Co-evaluation             End of Session   Activity Tolerance: Patient tolerated treatment well Patient left: in bed;with call bell/phone within reach;with family/visitor present     Time: 0921-0942 PT Time Calculation (min) (ACUTE ONLY): 21 min  Charges:  $Therapeutic Exercise: 8-22 mins                    G Codes:      Yekaterina Escutia 2015-03-01, 11:49 AM

## 2015-02-28 NOTE — Progress Notes (Signed)
Physical Therapy Treatment Patient Details Name: Chris Drake MRN: [id number] DOB: Jan 20, 1956 Today's Date: 02/28/2015    History of Present Illness L THR - posterior     PT Comments    Pt continues very cooperative and making steady progress with mobility.  Follow Up Recommendations  SNF     Equipment Recommendations  None recommended by PT    Recommendations for Other Services OT consult     Precautions / Restrictions Precautions Precautions: Posterior Hip;Fall Precaution Booklet Issued: Yes (comment) Precaution Comments: pt recalls 2/3 THP without cues Restrictions Weight Bearing Restrictions: Yes LLE Weight Bearing: Partial weight bearing LLE Partial Weight Bearing Percentage or Pounds: 25%    Mobility  Bed Mobility Overal bed mobility: Needs Assistance;+2 for physical assistance Bed Mobility: Supine to Sit     Supine to sit: Mod assist;+2 for physical assistance     General bed mobility comments: cues for sequence, adherence to THP and use of R LE to self assist; utilized pad to assist pt to EOB  Transfers Overall transfer level: Needs assistance Equipment used: Rolling walker (2 wheeled) Transfers: Sit to/from Stand Sit to Stand: +2 physical assistance;Mod assist         General transfer comment: cues for hip precautions and hand placement. assist to rise and steady and manage L LE to adhere to precautions.   Ambulation/Gait Ambulation/Gait assistance: Min assist;Mod assist;+2 physical assistance;+2 safety/equipment Ambulation Distance (Feet): 16 Feet (twice) Assistive device: Rolling walker (2 wheeled) Gait Pattern/deviations: Step-to pattern;Decreased step length - right;Decreased step length - left;Shuffle;Trunk flexed Gait velocity: decr   General Gait Details: cues for sequence, posture, position from RW, PWB and increased UE WB   Stairs            Wheelchair Mobility    Modified Rankin (Stroke Patients Only)       Balance                                    Cognition Arousal/Alertness: Awake/alert Behavior During Therapy: WFL for tasks assessed/performed Overall Cognitive Status: Within Functional Limits for tasks assessed                      Exercises Total Joint Exercises Ankle Circles/Pumps: AROM;Both;15 reps;Supine Quad Sets: AROM;Both;Supine;15 reps Heel Slides: AAROM;Left;Supine;20 reps Hip ABduction/ADduction: AAROM;Left;Supine;20 reps    General Comments        Pertinent Vitals/Pain Pain Assessment: 0-10 Pain Score: 8  Pain Location: L hip Pain Descriptors / Indicators: Aching;Sore Pain Intervention(s): Limited activity within patient's tolerance;Monitored during session;Premedicated before session;Ice applied    Home Living                      Prior Function            PT Goals (current goals can now be found in the care plan section) Acute Rehab PT Goals Patient Stated Goal: Walk without pain PT Goal Formulation: With patient Time For Goal Achievement: 03/06/15 Potential to Achieve Goals: Good Progress towards PT goals: Progressing toward goals    Frequency  7X/week    PT Plan Current plan remains appropriate    Co-evaluation             End of Session Equipment Utilized During Treatment: Gait belt Activity Tolerance: Patient tolerated treatment well;Patient limited by fatigue;Patient limited by pain Patient left: in chair;with call bell/phone within reach;with family/visitor present  Time: 1914-7829 PT Time Calculation (min) (ACUTE ONLY): 29 min  Charges:  $Gait Training: 23-37 mins $Therapeutic Exercise: 8-22 mins                    G Codes:      Ambermarie Honeyman March 14, 2015, 11:54 AM

## 2015-02-28 NOTE — Progress Notes (Signed)
CSW assisting with d/c planning. [facility] contacted. SNF is working with [employer] for SNF authorization. A decision from [employer] is pending. CSW will continue to follow to assist with d/c planning to SNF.  Cori Razor LCSW [phone]

## 2015-02-28 NOTE — Progress Notes (Signed)
Spoke with Avel Peace, who states there is no need to place patient on airborne precautions at this time d/t no symptoms of TB. Plan is to rule out TB before skilled nursing facility placement.

## 2015-02-28 NOTE — Progress Notes (Signed)
Physical Therapy Treatment Patient Details Name: Chris Drake MRN: [id number] DOB: 1955-11-16 Today's Date: 02/28/2015    History of Present Illness L THR - posterior     PT Comments    Pt progressing slowly with mobility - limited by pain, 25% PWB status and posterior THP.  Follow Up Recommendations  SNF     Equipment Recommendations  None recommended by PT    Recommendations for Other Services OT consult     Precautions / Restrictions Precautions Precautions: Posterior Hip;Fall Precaution Booklet Issued: Yes (comment) Precaution Comments: pt recalls 2/3 THP without cues Restrictions Weight Bearing Restrictions: Yes LLE Weight Bearing: Partial weight bearing LLE Partial Weight Bearing Percentage or Pounds: 25%    Mobility  Bed Mobility Overal bed mobility: Needs Assistance;+2 for physical assistance Bed Mobility: Sit to Supine       Sit to supine: Mod assist;+2 for physical assistance;+2 for safety/equipment   General bed mobility comments: cues for sequence, adherence to THP and use of R LE to self assist;   Transfers Overall transfer level: Needs assistance Equipment used: Rolling walker (2 wheeled) Transfers: Sit to/from Stand Sit to Stand: Min assist;Mod assist;+2 physical assistance;+2 safety/equipment         General transfer comment: cues for hip precautions and hand placement. assist to rise and steady and manage L LE to adhere to precautions.   Ambulation/Gait Ambulation/Gait assistance: Min assist;Mod assist Ambulation Distance (Feet): 16 Feet (twice - to/from bathroom) Assistive device: Rolling walker (2 wheeled) Gait Pattern/deviations: Step-to pattern;Decreased step length - right;Decreased step length - left;Shuffle;Trunk flexed Gait velocity: decr   General Gait Details: cues for sequence, posture, position from RW, PWB and increased UE WB   Stairs            Wheelchair Mobility    Modified Rankin (Stroke Patients Only)        Balance                                    Cognition Arousal/Alertness: Awake/alert Behavior During Therapy: WFL for tasks assessed/performed Overall Cognitive Status: Within Functional Limits for tasks assessed                      Exercises      General Comments        Pertinent Vitals/Pain Pain Assessment: 0-10 Pain Score: 8  Pain Location: L hip/thigh Pain Descriptors / Indicators: Sore;Aching;Tightness Pain Intervention(s): Limited activity within patient's tolerance;Monitored during session;Premedicated before session;Ice applied    Home Living                      Prior Function            PT Goals (current goals can now be found in the care plan section) Acute Rehab PT Goals Patient Stated Goal: Walk without pain PT Goal Formulation: With patient Time For Goal Achievement: 03/06/15 Potential to Achieve Goals: Good Progress towards PT goals: Progressing toward goals    Frequency  7X/week    PT Plan Current plan remains appropriate    Co-evaluation             End of Session Equipment Utilized During Treatment: Gait belt Activity Tolerance: Patient tolerated treatment well;Patient limited by fatigue;Patient limited by pain Patient left: in bed;with call bell/phone within reach;with family/visitor present     Time: 1440-1526 PT Time Calculation (min) (ACUTE ONLY): 46  min  Charges:  $Gait Training: 23-37 mins                    G Codes:      Chris Drake 03-23-2015, 3:40 PM

## 2015-03-01 LAB — CBC
HCT: 33.7 % — ABNORMAL LOW (ref 39.0–52.0)
Hemoglobin: 10.9 g/dL — ABNORMAL LOW (ref 13.0–17.0)
MCH: 27.2 pg (ref 26.0–34.0)
MCHC: 32.3 g/dL (ref 30.0–36.0)
MCV: 84 fL (ref 78.0–100.0)
PLATELETS: 298 10*3/uL (ref 150–400)
RBC: 4.01 MIL/uL — AB (ref 4.22–5.81)
RDW: 14.7 % (ref 11.5–15.5)
WBC: 10.7 10*3/uL — AB (ref 4.0–10.5)

## 2015-03-01 MED ORDER — DOCUSATE SODIUM 100 MG PO CAPS: 100.0000 mg | ORAL_CAPSULE | Freq: Two times a day (BID) | ORAL | Status: AC

## 2015-03-01 MED ORDER — TRAMADOL HCL 50 MG PO TABS: 50.0000 mg | ORAL_TABLET | Freq: Four times a day (QID) | ORAL | Status: AC | PRN

## 2015-03-01 MED ORDER — ACETAMINOPHEN 325 MG PO TABS: 650.0000 mg | ORAL_TABLET | Freq: Four times a day (QID) | ORAL | Status: AC | PRN

## 2015-03-01 MED ORDER — ONDANSETRON HCL 4 MG PO TABS: 4.0000 mg | ORAL_TABLET | Freq: Four times a day (QID) | ORAL | Status: AC | PRN

## 2015-03-01 MED ORDER — METOCLOPRAMIDE HCL 5 MG PO TABS: 5.0000 mg | ORAL_TABLET | Freq: Three times a day (TID) | ORAL | Status: AC | PRN

## 2015-03-01 MED ORDER — METHOCARBAMOL 500 MG PO TABS: 500.0000 mg | ORAL_TABLET | Freq: Four times a day (QID) | ORAL | Status: AC | PRN

## 2015-03-01 MED ORDER — OXYCODONE HCL 30 MG PO TABS: 30.0000 mg | ORAL_TABLET | ORAL | Status: AC | PRN

## 2015-03-01 MED ORDER — OXYCODONE HCL ER 60 MG PO T12A: 60.0000 mg | EXTENDED_RELEASE_TABLET | Freq: Two times a day (BID) | ORAL | Status: AC

## 2015-03-01 MED ORDER — BISACODYL 10 MG RE SUPP: 10.0000 mg | Freq: Every day | RECTAL | Status: AC | PRN

## 2015-03-01 MED ORDER — POLYETHYLENE GLYCOL 3350 17 G PO PACK: 17.0000 g | PACK | Freq: Every day | ORAL | Status: AC | PRN

## 2015-03-01 MED ORDER — RIVAROXABAN 10 MG PO TABS: 10.0000 mg | ORAL_TABLET | Freq: Every day | ORAL | Status: AC

## 2015-03-01 NOTE — Progress Notes (Signed)
Physical Therapy Treatment Patient Details Name: Chris Drake MRN: [id number] DOB: 1956-08-01 Today's Date: 03/01/2015    History of Present Illness L THR - posterior     PT Comments    POD # 3 am session.  Pt OOB in recliner via OT.  Max c/o hip pain/discomfort and swelling.  Pt requires increased time and demonstrates limited activity tolerance.  Very unsteady gait.  Limited WB status. HIGH FALL RISK. Pt will need ST Rehab at SNF.  Follow Up Recommendations  SNF     Equipment Recommendations  None recommended by PT    Recommendations for Other Services       Precautions / Restrictions Precautions Precautions: Posterior Hip;Fall Precaution Comments: pt recalls 2/3 THP without cues Restrictions Weight Bearing Restrictions: Yes LLE Weight Bearing: Partial weight bearing LLE Partial Weight Bearing Percentage or Pounds: 25%    Mobility  Bed Mobility Overal bed mobility: Needs Assistance;+2 for physical assistance Bed Mobility: Sit to Supine     Supine to sit: Mod assist Sit to supine: Mod assist;+2 for physical assistance;+2 for safety/equipment   General bed mobility comments: assist for Legs and trunk; cues for technique plus use of trazpeze.  Max c/o pain.   Transfers Overall transfer level: Needs assistance Equipment used: Rolling walker (2 wheeled) Transfers: Sit to/from Stand Sit to Stand: Mod assist;+2 safety/equipment Stand pivot transfers: Min assist;+2 safety/equipment       General transfer comment: cues for THPs, UE/LE placement plus increased time due to pain level  Ambulation/Gait Ambulation/Gait assistance: Min assist;Mod assist;+2 safety/equipment Ambulation Distance (Feet): 10 Feet (5 fett x 2 to and from bathroom only this session due to pain level.) Assistive device: Rolling walker (2 wheeled) Gait Pattern/deviations: Step-to pattern;Decreased stance time - left;Trunk flexed Gait velocity: decreased   General Gait Details: 50% VC's on  safety and to adnhere to THP and PWB.  Limited activity tolerance due to pain level.    Stairs            Wheelchair Mobility    Modified Rankin (Stroke Patients Only)       Balance                                    Cognition Arousal/Alertness: Awake/alert Behavior During Therapy: WFL for tasks assessed/performed Overall Cognitive Status: Within Functional Limits for tasks assessed                      Exercises      General Comments        Pertinent Vitals/Pain Pain Assessment: 0-10 Pain Score: 8  Pain Location: L hip/thigh Pain Descriptors / Indicators: Aching;Sore;Tender;Tightness Pain Intervention(s): Monitored during session;Premedicated before session;Repositioned;Ice applied    Home Living                      Prior Function            PT Goals (current goals can now be found in the care plan section) Acute Rehab PT Goals Patient Stated Goal: Walk without pain Progress towards PT goals: Progressing toward goals    Frequency  7X/week    PT Plan Current plan remains appropriate    Co-evaluation             End of Session Equipment Utilized During Treatment: Gait belt Activity Tolerance: Patient tolerated treatment well;Patient limited by fatigue;Patient limited by pain Patient left:  in bed;with call bell/phone within reach;with family/visitor present     Time: 4098-1191 PT Time Calculation (min) (ACUTE ONLY): 29 min  Charges:  $Gait Training: 8-22 mins $Therapeutic Activity: 8-22 mins                    G Codes:      Felecia Shelling  PTA [facility]  Acute  Rehab Pager      [phone]

## 2015-03-01 NOTE — Progress Notes (Addendum)
CSW assisting with d/c planning. [facility] contacted. SNF is working with [employer] for authorization. A decision is pending. CSW requested SNF to accept LOG while waiting for a decision from [facility]. CSW is waiting to hear back from SNF.  Cori Razor LCSW [phone]  1310 : Message left for Admissions Coordinator at [facility] to see if BCBS has provided authorization or if Administrator is willing to accept LOG from [facility]. New SNF search initiated [facility] [place] ) to see if any other SNF will accept LOG while waiting for [employer]. SNF offer spending. [place] of state [facility] requested , incase needed for [place] placement. Spoke with pt / mother this am about the possibility of looking at a different SNF if BCBS did not make a decision regarding authorization at the time of d/c. Pt is unable to pay out of pocket for SNF placement while waiting for authorization.  Cori Razor LCSW [phone]   1700 : No return call from SNF. Message left for SNF to contact weekend CSW if [facility] authorization is received. MD updated. D/C cancelled for today. MD feels pt needs closer observation that can be provided at [facility]. No other SNF's willing to accept LOG.  Cori Razor LCSW [phone]

## 2015-03-01 NOTE — Progress Notes (Signed)
Occupational Therapy Treatment Patient Details Name: Chris Drake MRN: [id number] DOB: 1955-12-22 Today's Date: 03/01/2015    History of present illness L THR - posterior    OT comments  Pt is making good gains in OT.  Had +2 for safety for transfer, anticipate pt will only need +1 soon. Pt was able to maintain 25% PWB and verbalizes understanding of posterior THPs:  Will reinforce durign function  Follow Up Recommendations  SNF    Equipment Recommendations  None recommended by OT (has 3:1)    Recommendations for Other Services      Precautions / Restrictions Precautions Precautions: Posterior Hip;Fall Precaution Comments: pt recalls 2/3 THP without cues (and PWB 25%) Restrictions LLE Weight Bearing: Partial weight bearing LLE Partial Weight Bearing Percentage or Pounds: 25%       Mobility Bed Mobility         Supine to sit: Mod assist     General bed mobility comments: assist for Legs and trunk; cues for technique  Transfers   Equipment used: Rolling walker (2 wheeled) Transfers: Sit to/from [employer] Sit to Stand: Min assist;+2 safety/equipment;From elevated surface Stand pivot transfers: Min assist;+2 safety/equipment       General transfer comment: cues for THPs, UE/LE placement    Balance                                   ADL               Lower Body Bathing: Moderate assistance;With adaptive equipment;Sit to/from stand       Lower Body Dressing: Moderate assistance;With adaptive equipment;Sit to/from stand   Toilet Transfer: Minimal assistance;+2 for safety/equipment;Stand-pivot (to recliner from elevated bed)             General ADL Comments: reviewed THPs and transferred to recliner to practiced AE.  Used sock aide only on RLE as pt cannot lift L.  Used reacher on both.        Vision                     Perception     Praxis      Cognition   Behavior During Therapy: WFL for tasks  assessed/performed Overall Cognitive Status: Within Functional Limits for tasks assessed                       Extremity/Trunk Assessment               Exercises     Shoulder Instructions       General Comments      Pertinent Vitals/ Pain       Pain Score: 8  Pain Location: L hip and thigh Pain Descriptors / Indicators: Aching Pain Intervention(s): Limited activity within patient's tolerance;Monitored during session;Repositioned;RN gave pain meds during session;Ice applied  Home Living                                          Prior Functioning/Environment              Frequency Min 2X/week     Progress Toward Goals  OT Goals(current goals can now be found in the care plan section)  Progress towards OT goals: Progressing toward goals  Acute Rehab OT Goals Patient Stated  Goal: Walk without pain  Plan Discharge plan needs to be updated    Co-evaluation                 End of Session     Activity Tolerance Patient tolerated treatment well   Patient Left in chair;with call bell/phone within reach   Nurse Communication  (tech: pt up in chair with feet down, check on him for comfor)        Time: 9935-7017 OT Time Calculation (min): 38 min  Charges: OT General Charges $OT Visit: 1 Procedure OT Evaluation $Initial OT Evaluation Tier I: 1 Procedure OT Treatments $Self Care/Home Management : 23-37 mins $Therapeutic Activity: 8-22 mins  Caileen Veracruz 03/01/2015, 10:00 AM Marica Otter, OTR/L [phone] 03/01/2015

## 2015-03-01 NOTE — Progress Notes (Addendum)
   Subjective: 3 Days Post-Op Procedure(s) (LRB): TOTAL LEFT HIP ARTHROPLASTY (POSTERIOR APPROACH)  (Left) Patient reports pain as mild.   Patient seen in rounds with Dr. Lequita Halt. Family in room.  Doing better since surgery.  Briefly discussed the edema in leg and informed patient that it will continue for some time following surgery and during rehab. Patient is well, but has had some minor complaints of pain in the hip, requiring pain medications Patient is ready to go to the SNF today when bed available.  At time of this note, still waiting on insurance approval ([employer]BS).  SNF is working with [employer] for SNF authorization. A decision from [employer]BS is pending.  Objective: Vital signs in last 24 hours: Temp:  [98.4 F (36.9 C)-98.9 F (37.2 C)] 98.6 F (37 C) (06/24 0509) Pulse Rate:  [82-90] 82 (06/24 0509) Resp:  [16] 16 (06/24 0509) BP: (117-140)/(74-81) 140/81 mmHg (06/24 0509) SpO2:  [95 %-97 %] 95 % (06/24 0509)  Intake/Output from previous day:  Intake/Output Summary (Last 24 hours) at 03/01/15 0841 Last data filed at 03/01/15 0618  Gross per 24 hour  Intake    900 ml  Output   1675 ml  Net   -775 ml   Labs:  Recent Labs  02/27/15 0405 02/28/15 0420 03/01/15 0431  HGB 12.2* 10.7* 10.9*    Recent Labs  02/28/15 0420 03/01/15 0431  WBC 14.5* 10.7*  RBC 3.82* 4.01*  HCT 32.5* 33.7*  PLT 310 298    Recent Labs  02/27/15 0405 02/28/15 0420  NA 137 138  K 4.2 4.0  CL 100* 103  CO2 29 29  BUN 5* 7  CREATININE 0.59* 0.52*  GLUCOSE 145* 108*  CALCIUM 8.7* 8.4*   No results for input(s): LABPT, INR in the last 72 hours.  EXAM: General - Patient is Alert, Appropriate and Oriented Extremity - Neurovascular intact Sensation intact distally Dorsiflexion/Plantar flexion intact Incision - clean, dry, no drainage Motor Function - intact, moving foot and toes well on exam.   Assessment/Plan: 3 Days Post-Op Procedure(s) (LRB): TOTAL LEFT HIP ARTHROPLASTY  (POSTERIOR APPROACH)  (Left) Procedure(s) (LRB): TOTAL LEFT HIP ARTHROPLASTY (POSTERIOR APPROACH)  (Left) Past Medical History  Diagnosis Date  . Hypertension   . Arthritis   . Gout   . Slow urinary stream   . 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   Weight as of this encounter: 83.462 kg (184 lb). Up with therapy Discharge to SNF Diet - Cardiac diet Follow up - in 2 weeks Activity - PWB 25 % to the Left Leg - Do Not Advance WB Status Disposition - Skilled nursing facility - Looking into a facility in [place] close to home but have not received authorization at this time for [employer].  [facility] contacted as a backup plan.  SNF is working with [employer] for SNF authorization. A decision from BCBS is pending. CSW will continue to follow to assist with d/c planning to SNF. Condition Upon Discharge - Pending at this time.  Patient is stable at time of summary. D/C Meds - See DC Summary DVT Prophylaxis - Xarelto  Avel Peace, PA-C Orthopaedic Surgery 03/01/2015, 8:41 AM

## 2015-03-01 NOTE — Progress Notes (Signed)
Physical Therapy Treatment Patient Details Name: Chris Drake MRN: [id number] DOB: 05/11/1956 Today's Date: 03/01/2015    History of Present Illness L THR - posterior     PT Comments    POD # 2 pm session.  Assisted pt OOB to recliner only due to pain level and fatigue.  Great difficulty adhering to 25% WBing and unsteady gait.  Limited activity tolerance.  Positioned in recliner and applied ICE to l hip.  Pt will need ST Rehab at SNF prior to returning home.   Follow Up Recommendations  SNF     Equipment Recommendations  None recommended by PT    Recommendations for Other Services       Precautions / Restrictions Precautions Precautions: Posterior Hip;Fall Precaution Comments: pt recalls 2/3 THP without cues Restrictions Weight Bearing Restrictions: Yes LLE Weight Bearing: Partial weight bearing LLE Partial Weight Bearing Percentage or Pounds: 25%    Mobility  Bed Mobility Overal bed mobility: Needs Assistance Bed Mobility: Supine to Sit     Supine to sit: Max assist Sit to supine: Mod assist;+2 for physical assistance;+2 for safety/equipment   General bed mobility comments: assist for Legs and trunk; cues for technique plus use of bed pad to swival hips to EOB  Max c/o pain.   Transfers Overall transfer level: Needs assistance Equipment used: Rolling walker (2 wheeled) Transfers: Sit to/from Stand Sit to Stand: Mod assist;+2 physical assistance;From elevated surface         General transfer comment: cues for THPs, UE/LE placement plus increased time due to pain level.  assisted OOB to recliner.   Ambulation/Gait Ambulation/Gait assistance: Min assist;Mod assist Ambulation Distance (Feet): 2 Feet Assistive device: Rolling walker (2 wheeled) Gait Pattern/deviations: Step-to pattern;Decreased stance time - left;Trunk flexed Gait velocity: decreased   General Gait Details: 50% VC's on safety and to adnhere to THP and PWB.  Limited activity tolerance due to  pain level.    Stairs            Wheelchair Mobility    Modified Rankin (Stroke Patients Only)       Balance                                    Cognition Arousal/Alertness: Awake/alert Behavior During Therapy: WFL for tasks assessed/performed Overall Cognitive Status: Within Functional Limits for tasks assessed                      Exercises      General Comments        Pertinent Vitals/Pain Pain Assessment: 0-10 Pain Score: 8  Pain Location: L hip/thigh Pain Descriptors / Indicators: Aching;Sore;Tender;Tightness Pain Intervention(s): Monitored during session;Premedicated before session;Repositioned;Ice applied    Home Living                      Prior Function            PT Goals (current goals can now be found in the care plan section) Progress towards PT goals: Progressing toward goals    Frequency  7X/week    PT Plan Current plan remains appropriate    Co-evaluation             End of Session Equipment Utilized During Treatment: Gait belt Activity Tolerance: Patient tolerated treatment well;Patient limited by fatigue;Patient limited by pain Patient left: in bed;with call bell/phone within reach;with family/visitor present  Time: 0454-0981 PT Time Calculation (min) (ACUTE ONLY): 17 min  Charges:   $Therapeutic Activity: 8-22 mins                    G Codes:      Felecia Shelling  PTA [facility]  Acute  Rehab Pager      [phone]

## 2015-03-01 NOTE — Discharge Summary (Signed)
Physician Discharge Summary   Patient ID: Chris Drake MRN: [id number] DOB/AGE: [birth year] 59 y.o.  Admit date: 02/26/2015 Discharge date: 03-01-2015  Primary Diagnosis:  Malunion/nonunion, left intertrochanteric femur fracture  Admission Diagnoses:  Past Medical History  Diagnosis Date  . Hypertension   . Arthritis   . Gout   . Slow urinary stream   . 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   Weight as of this encounter: 83.462 kg (184 lb).  Procedure(s) (LRB): TOTAL LEFT HIP ARTHROPLASTY (POSTERIOR APPROACH)  (Left)   Consults: None  HPI: Chris Drake is a 59 year old male who had a fall with left hip pain approximately 2-1/2 months ago. I saw him in the office 2 weeks ago, noted that he had a shortened externally rotated limb. His radiographs taken that day showed a varus femoral neck with an intertrochanteric femur fracture, which was partially healed. An MRI, which he brought with him that showed the same thing. Given that it had already healed in a shortened varus position with retroversion, I felt there was no way we were going to be able to take that down and fix it with internal fixation. We discussed treatment options and elected for a total hip arthroplasty.  Laboratory Data: Admission on 02/26/2015  Component Date Value Ref Range Status  . ABO/RH(D) 02/26/2015 O NEG   Final  . Antibody Screen 02/26/2015 NEG   Final  . Sample Expiration 02/26/2015 03/01/2015   Final  . WBC 02/27/2015 10.5  4.0 - 10.5 K/uL Final  . RBC 02/27/2015 4.48  4.22 - 5.81 MIL/uL Final  . Hemoglobin 02/27/2015 12.2* 13.0 - 17.0 g/dL Final  . HCT 02/27/2015 37.7* 39.0 - 52.0 % Final  . MCV 02/27/2015 84.2  78.0 - 100.0 fL Final  . MCH  02/27/2015 27.2  26.0 - 34.0 pg Final  . MCHC 02/27/2015 32.4  30.0 - 36.0 g/dL Final  . RDW 02/27/2015 14.2  11.5 - 15.5 % Final  . Platelets 02/27/2015 320  150 - 400 K/uL Final  . Sodium 02/27/2015 137  135 - 145 mmol/L Final  . Potassium 02/27/2015 4.2  3.5 - 5.1 mmol/L Final  . Chloride 02/27/2015 100* 101 - 111 mmol/L Final  . CO2 02/27/2015 29  22 - 32 mmol/L Final  . Glucose, Bld 02/27/2015 145* 65 - 99 mg/dL Final  . BUN 02/27/2015 5* 6 - 20 mg/dL Final  . Creatinine, Ser 02/27/2015 0.59* 0.61 - 1.24 mg/dL Final  . Calcium 02/27/2015 8.7* 8.9 - 10.3 mg/dL Final  . GFR calc non Af Amer 02/27/2015 >60  >60 mL/min Final  . GFR calc Af Amer 02/27/2015 >60  >60 mL/min Final   Comment: (NOTE) The eGFR has been calculated using the CKD EPI equation. This calculation has not been validated in all clinical situations. eGFR's persistently <60 mL/min signify possible Chronic Kidney Disease.   . Anion gap 02/27/2015 8  5 - 15 Final  . WBC 02/28/2015 14.5* 4.0 - 10.5 K/uL Final  . RBC 02/28/2015 3.82* 4.22 - 5.81 MIL/uL Final  . Hemoglobin 02/28/2015 10.7* 13.0 - 17.0 g/dL Final  . HCT 02/28/2015 32.5* 39.0 - 52.0 % Final  . MCV 02/28/2015 85.1  78.0 - 100.0 fL Final  .  MCH 02/28/2015 28.0  26.0 - 34.0 pg Final  . MCHC 02/28/2015 32.9  30.0 - 36.0 g/dL Final  . RDW 77/65/2859 14.7  11.5 - 15.5 % Final  . Platelets 02/28/2015 310  150 - 400 K/uL Final  . Sodium 02/28/2015 138  135 - 145 mmol/L Final  . Potassium 02/28/2015 4.0  3.5 - 5.1 mmol/L Final  . Chloride 02/28/2015 103  101 - 111 mmol/L Final  . CO2 02/28/2015 29  22 - 32 mmol/L Final  . Glucose, Bld 02/28/2015 108* 65 - 99 mg/dL Final  . BUN 18/60/2221 7  6 - 20 mg/dL Final  . Creatinine, Ser 02/28/2015 0.52* 0.61 - 1.24 mg/dL Final  . Calcium 17/05/4840 8.4* 8.9 - 10.3 mg/dL Final  . GFR calc non Af Amer 02/28/2015 >60  >60 mL/min Final  . GFR calc Af Amer 02/28/2015 >60  >60 mL/min Final   Comment: (NOTE) The eGFR has  been calculated using the CKD EPI equation. This calculation has not been validated in all clinical situations. eGFR's persistently <60 mL/min signify possible Chronic Kidney Disease.   . Anion gap 02/28/2015 6  5 - 15 Final  . WBC 03/01/2015 10.7* 4.0 - 10.5 K/uL Final  . RBC 03/01/2015 4.01* 4.22 - 5.81 MIL/uL Final  . Hemoglobin 03/01/2015 10.9* 13.0 - 17.0 g/dL Final  . HCT 45/65/4728 33.7* 39.0 - 52.0 % Final  . MCV 03/01/2015 84.0  78.0 - 100.0 fL Final  . MCH 03/01/2015 27.2  26.0 - 34.0 pg Final  . MCHC 03/01/2015 32.3  30.0 - 36.0 g/dL Final  . RDW 25/11/1172 14.7  11.5 - 15.5 % Final  . Platelets 03/01/2015 298  150 - 400 K/uL Final  Hospital Outpatient Visit on 02/22/2015  Component Date Value Ref Range Status  . aPTT 02/22/2015 34  24 - 37 seconds Final  . WBC 02/22/2015 9.4  4.0 - 10.5 K/uL Final  . RBC 02/22/2015 5.06  4.22 - 5.81 MIL/uL Final  . Hemoglobin 02/22/2015 14.2  13.0 - 17.0 g/dL Final  . HCT 34/36/2058 43.7  39.0 - 52.0 % Final  . MCV 02/22/2015 86.4  78.0 - 100.0 fL Final  . MCH 02/22/2015 28.1  26.0 - 34.0 pg Final  . MCHC 02/22/2015 32.5  30.0 - 36.0 g/dL Final  . RDW 59/53/7962 14.6  11.5 - 15.5 % Final  . Platelets 02/22/2015 344  150 - 400 K/uL Final  . Sodium 02/22/2015 139  135 - 145 mmol/L Final  . Potassium 02/22/2015 4.3  3.5 - 5.1 mmol/L Final  . Chloride 02/22/2015 96* 101 - 111 mmol/L Final  . CO2 02/22/2015 34* 22 - 32 mmol/L Final  . Glucose, Bld 02/22/2015 105* 65 - 99 mg/dL Final  . BUN 67/01/1862 6  6 - 20 mg/dL Final  . Creatinine, Ser 02/22/2015 0.67  0.61 - 1.24 mg/dL Final  . Calcium 63/56/6853 9.4  8.9 - 10.3 mg/dL Final  . Total Protein 02/22/2015 7.7  6.5 - 8.1 g/dL Final  . Albumin 98/95/3737 3.6  3.5 - 5.0 g/dL Final  . AST 35/46/1499 22  15 - 41 U/L Final  . ALT 02/22/2015 21  17 - 63 U/L Final  . Alkaline Phosphatase 02/22/2015 146* 38 - 126 U/L Final  . Total Bilirubin 02/22/2015 0.1* 0.3 - 1.2 mg/dL Final  . GFR calc  non Af Amer 02/22/2015 >60  >60 mL/min Final  . GFR calc Af Amer 02/22/2015 >60  >60 mL/min Final  Comment: (NOTE) The eGFR has been calculated using the CKD EPI equation. This calculation has not been validated in all clinical situations. eGFR's persistently <60 mL/min signify possible Chronic Kidney Disease.   . Anion gap 02/22/2015 9  5 - 15 Final  . Prothrombin Time 02/22/2015 12.7  11.6 - 15.2 seconds Final  . INR 02/22/2015 0.93  0.00 - 1.49 Final  . ABO/RH(D) 02/22/2015 O NEG   Final  . Antibody Screen 02/22/2015 NEG   Final  . Sample Expiration 02/22/2015 03/01/2015   Final  . Color, Urine 02/22/2015 YELLOW  YELLOW Final  . APPearance 02/22/2015 CLEAR  CLEAR Final  . Specific Gravity, Urine 02/22/2015 1.012  1.005 - 1.030 Final  . pH 02/22/2015 7.0  5.0 - 8.0 Final  . Glucose, UA 02/22/2015 NEGATIVE  NEGATIVE mg/dL Final  . Hgb urine dipstick 02/22/2015 NEGATIVE  NEGATIVE Final  . Bilirubin Urine 02/22/2015 NEGATIVE  NEGATIVE Final  . Ketones, ur 02/22/2015 NEGATIVE  NEGATIVE mg/dL Final  . Protein, ur 02/22/2015 NEGATIVE  NEGATIVE mg/dL Final  . Urobilinogen, UA 02/22/2015 1.0  0.0 - 1.0 mg/dL Final  . Nitrite 02/22/2015 NEGATIVE  NEGATIVE Final  . Leukocytes, UA 02/22/2015 NEGATIVE  NEGATIVE Final   MICROSCOPIC NOT DONE ON URINES WITH NEGATIVE PROTEIN, BLOOD, LEUKOCYTES, NITRITE, OR GLUCOSE <1000 mg/dL.  Marland Kitchen MRSA, PCR 02/22/2015 NEGATIVE  NEGATIVE Final  . Staphylococcus aureus 02/22/2015 POSITIVE* NEGATIVE Final   Comment:        The Xpert SA Assay (FDA approved for NASAL specimens in patients over 54 years of age), is one component of a comprehensive surveillance program.  Test performance has been validated by [facility] for patients greater than or equal to 48 year old. It is not intended to diagnose infection nor to guide or monitor treatment.   . ABO/RH(D) 02/22/2015 O NEG   Final     X-Rays:Dg Chest 2 View  02/28/2015   CLINICAL DATA:  Evaluate for TB   EXAM: CHEST  2 VIEW  COMPARISON:  None  FINDINGS: Exam detail is diminished due to kyphoscoliosis deformity and hypo inflation of the lungs. There is scar versus platelike atelectasis within the left base. No airspace consolidation identified. No calcified granulomas or lymph nodes identified.  IMPRESSION: 1. Exam detail is diminished as above. 2. No active cardiopulmonary abnormalities.   Electronically Signed   By: Kerby Moors M.D.   On: 02/28/2015 10:55   Dg Pelvis Portable  02/26/2015   CLINICAL DATA:  LEFT hip surgery.  Postoperative radiographs.  EXAM: PORTABLE PELVIS 1-2 VIEWS  COMPARISON:  None.  FINDINGS: New LEFT total hip arthroplasty with expected postsurgical changes in the soft tissues. The LEFT hip appears located. There is lucency adjacent to the tip of the femoral stem, suspicious for a nondisplaced fracture of the femoral shaft. Bones appear osteopenic.  IMPRESSION: 1. New LEFT total hip arthroplasty. 2. Lucency around the tip of the femoral stem suspicious for fracture. 3. These results will be called to the ordering clinician or representative by the Radiologist Assistant, and communication documented in the PACS or zVision Dashboard.   Electronically Signed   By: Dereck Ligas M.D.   On: 02/26/2015 19:14   Mr Knee Left  Wo Contrast  02/06/2015   CLINICAL DATA:  Patient slid off the bed while staying at the [facility] in [place], [place] on December 17, 2014. His left knee, leg was bent underneath him. Cannot weight bear. The left lower leg is very swollen, slightly red  and "shinny" looking. The incident occurred around 7:00 pm. Patient is in much pain.  EXAM: MRI OF THE LEFT KNEE WITHOUT CONTRAST  TECHNIQUE: Multiplanar, multisequence MR imaging of the knee was performed. No intravenous contrast was administered.  COMPARISON:  None.  FINDINGS: There is extensive subcutaneous edema surrounding the knee, greatest anteriorly. There is possible fluid in the prepatellar bursa.  MENISCI   Medial meniscus:  Intact with normal morphology.  Lateral meniscus:  Discoid morphology without evidence of tear.  LIGAMENTS  Cruciates:  Intact.  Collaterals:  Intact.  CARTILAGE  Patellofemoral:  Preserved.  Medial:  Mild chondral thinning.  No focal chondral defect.  Lateral:  Preserved.  OTHER  Joint:  No significant joint effusion.  Popliteal Fossa:  Unremarkable. No significant Baker's cyst.  Extensor Mechanism:  Intact.  Bones:  No significant extra-articular osseous findings.  IMPRESSION: 1. Prominent subcutaneous edema surrounding the knee with more focal component anteriorly suspicious for prepatellar bursitis. The generalized edema is nonspecific and may reflect cellulitis, lymphedema or venous insufficiency. 2. Mild medial compartment degenerative chondrosis. No acute osseous findings. 3. Intact menisci, cruciate and collateral ligaments.   Electronically Signed   By: Richardean Sale M.D.   On: 02/06/2015 14:19   Mr Hip Left Wo Contrast  02/06/2015   CLINICAL DATA:  Left hip and leg and knee pain since a fall on 12/17/2014 . Left lower leg swelling and redness.  EXAM: MR OF THE LEFT HIP WITHOUT CONTRAST  TECHNIQUE: Multiplanar, multisequence MR imaging was performed. No intravenous contrast was administered.  COMPARISON:  None.  FINDINGS: Bones: There is an impacted intertrochanteric/low neck fracture of the proximal left femur with angulation. This is subacute. There is edema in the adjacent soft tissues.  There is also focal edema in the inferior pubic ramus adjacent to the ischial tuberosity as well slight edema in the left pubic body and in the adjacent adductor musculature on the left. The visualized portion of the sacrum appears normal. There are moderate arthritic changes of the right hip.  Articular cartilage and labrum  Articular cartilage: There is diffuse thinning of the articular cartilage with superior joint space narrowing.  Labrum:  There is a small focal anterior superior labral tear.   Joint or bursal effusion  Joint effusion:  Small hip effusion.  Bursae:  No bursitis.  Muscles and tendons  Muscles and tendons: Slight atrophy of the left gluteal muscles. Edema in the subcutaneous soft tissues lateral to the left hip with a small seroma superficial to the fascia at the posterior lateral aspect of the left hip.  Other findings  Miscellaneous:  Tiny right posterior lateral bladder diverticulum.  IMPRESSION: Impacted slightly angulated subacute low neck/intertrochanteric fracture of the proximal left femur. Critical Value/emergent results were called by telephone at the time of interpretation on 02/06/2015 at 1:53 pm to [facility] PA, who verbally acknowledged these results.   Electronically Signed   By: Lorriane Shire M.D.   On: 02/06/2015 13:53    EKG: Orders placed or performed during the hospital encounter of 02/22/15  . EKG 12-Lead  . EKG 12-Lead     Hospital Course: Patient was admitted to [facility] and taken to the OR and underwent the above state procedure without complications.  Patient tolerated the procedure well and was later transferred to the recovery room and then to the orthopaedic floor for postoperative care.  They were given PO and IV analgesics for pain control following their surgery.  They were given 24  hours of postoperative antibiotics of  Anti-infectives    Start     Dose/Rate Route Frequency Ordered Stop   02/27/15 0600  ceFAZolin (ANCEF) IVPB 2 g/50 mL premix     2 g 100 mL/hr over 30 Minutes Intravenous On call to O.R. 02/26/15 1351 02/26/15 1655   02/26/15 2300  ceFAZolin (ANCEF) IVPB 2 g/50 mL premix     2 g 100 mL/hr over 30 Minutes Intravenous Every 6 hours 02/26/15 1957 02/27/15 0611     and started on DVT prophylaxis in the form of Xarelto.   PT and OT were ordered for total hip protocol.  The patient was place PWB 25 % only to the left leg with therapy. Discharge planning was consulted to help with postop disposition and equipment  needs.  Patient had a tough night on the evening of surgery with pain after surgery.  They started to get up OOB with therapy on day one.  Hemovac drain was pulled without difficulty.  The knee immobilizer was removed and discontinued.  Continued to work with therapy into day two.  Dressing was changed on day two and the incision was healing well.  By day three, the patient had progressed with therapy and meeting their goals.  Incision was healing well.  Patient was seen in rounds and was ready to go to the SNF.   Diet: Cardiac diet Activity:PWB 25 % to the Left Leg  Do Not Advance WB Status No bending hip over 90 degrees- A "L" Angle Do not cross legs Do not let foot roll inward When turning these patients a pillow should be placed between the patient's legs to prevent crossing. Patients should have the affected knee fully extended when trying to sit or stand from all surfaces to prevent excessive hip flexion. When ambulating and turning toward the affected side the affected leg should have the toes turned out prior to moving the walker and the rest of patient's body as to prevent internal rotation/ turning in of the leg. Abduction pillows are the most effective way to prevent a patient from not crossing legs or turning toes in at rest. If an abduction pillow is not ordered placing a regular pillow length wise between the patient's legs is also an effective reminder. It is imperative that these precautions be maintained so that the surgical hip does not dislocate. Follow-up:in 2 weeks Disposition - Skilled nursing facility - Looking into a facility in [place] close to home but have not received authorization at this time for [employer]. Discharged Condition: Pending at time of summary.  Patient is stable at time of summary.       Discharge Instructions    Call MD / Call 911    Complete by:  As directed   If you experience chest pain or shortness of breath, CALL 911 and be transported to the  hospital emergency room.  If you develope a fever above 101 F, pus (white drainage) or increased drainage or redness at the wound, or calf pain, call your surgeon's office.     Change dressing    Complete by:  As directed   You may change your dressing dressing daily with sterile 4 x 4 inch gauze dressing and paper tape.  Do not submerge the incision under water.     Constipation Prevention    Complete by:  As directed   Drink plenty of fluids.  Prune juice may be helpful.  You may use a stool softener, such as Colace (over  the counter) 100 mg twice a day.  Use MiraLax (over the counter) for constipation as needed.     Diet - low sodium heart healthy    Complete by:  As directed      Discharge instructions    Complete by:  As directed   Pick up stool softner and laxative for home use following surgery while on pain medications. Do not submerge incision under water. Please use good hand washing techniques while changing dressing each day. May shower starting three days after surgery. Please use a clean towel to pat the incision dry following showers. Continue to use ice for pain and swelling after surgery. Do not use any lotions or creams on the incision until instructed by your surgeon. Hip precautions.  Total Hip Protocol.  Take Xarelto for two and a half more weeks, then discontinue Xarelto. Once the patient has completed the blood thinner regimen, then take a Baby 81 mg Aspirin daily for three more weeks.  Postoperative Constipation Protocol  Constipation - defined medically as fewer than three stools per week and severe constipation as less than one stool per week.  One of the most common issues patients have following surgery is constipation.  Even if you have a regular bowel pattern at home, your normal regimen is likely to be disrupted due to multiple reasons following surgery.  Combination of anesthesia, postoperative narcotics, change in appetite and fluid intake all can affect  your bowels.  In order to avoid complications following surgery, here are some recommendations in order to help you during your recovery period.  Colace (docusate) - Pick up an over-the-counter form of Colace or another stool softener and take twice a day as long as you are requiring postoperative pain medications.  Take with a full [place] of water daily.  If you experience loose stools or diarrhea, hold the colace until you stool forms back up.  If your symptoms do not get better within 1 week or if they get worse, check with your doctor.  Dulcolax (bisacodyl) - Pick up over-the-counter and take as directed by the product packaging as needed to assist with the movement of your bowels.  Take with a full [place] of water.  Use this product as needed if not relieved by Colace only.   MiraLax (polyethylene glycol) - Pick up over-the-counter to have on hand.  MiraLax is a solution that will increase the amount of water in your bowels to assist with bowel movements.  Take as directed and can mix with a [place] of water, juice, soda, coffee, or tea.  Take if you go more than two days without a movement. Do not use MiraLax more than once per day. Call your doctor if you are still constipated or irregular after using this medication for 7 days in a row.  If you continue to have problems with postoperative constipation, please contact the office for further assistance and recommendations.  If you experience "the worst abdominal pain ever" or develop nausea or vomiting, please contact the office immediatly for further recommendations for treatment.   When discharged from the skilled rehab facility, please have the facility set up the patient's [place] [facility] prior to being released.  Please make sure this gets set up prior to release in order to avoid any lapse of therapy following the rehab stay.  Also provide the patient with their medications at time of release from the facility to include their pain  medication, the muscle relaxants, and their blood  thinner medication.  If the patient is still at the rehab facility at time of follow up appointment, please also assist the patient in arranging follow up appointment in our office and any transportation needs. ICE to the affected knee or hip every three hours for 30 minutes at a time and then as needed for pain and swelling.  PARTIAL WEIGHT BEARING 25% TO LEFT LEG - DO NOT ADVANCE WB STATUS.     Do not sit on low chairs, stoools or toilet seats, as it may be difficult to get up from low surfaces    Complete by:  As directed      Driving restrictions    Complete by:  As directed   No driving until released by the physician.     Follow the hip precautions as taught in Physical Therapy    Complete by:  As directed      Increase activity slowly as tolerated    Complete by:  As directed      Lifting restrictions    Complete by:  As directed   No lifting until released by the physician.     Partial weight bearing    Complete by:  As directed   % Body Weight:  25%  Laterality:  left  Extremity:  Lower     Patient may shower    Complete by:  As directed   You may shower without a dressing once there is no drainage.  Do not wash over the wound.  If drainage remains, do not shower until drainage stops.     TED hose    Complete by:  As directed   Use stockings (TED hose) for 3 weeks on both leg(s).  You may remove them at night for sleeping.            Medication List    STOP taking these medications        CALCIUM + D PO     FISH OIL PO     NON FORMULARY      TAKE these medications        acetaminophen 325 MG tablet  Commonly known as:  TYLENOL  Take 2 tablets (650 mg total) by mouth every 6 (six) hours as needed for mild pain (or Fever >/= 101).     bisacodyl 10 MG suppository  Commonly known as:  DULCOLAX  Place 1 suppository (10 mg total) rectally daily as needed for mild constipation or moderate constipation.      cetirizine 10 MG tablet  Commonly known as:  ZYRTEC  Take 10 mg by mouth daily.     docusate sodium 100 MG capsule  Commonly known as:  COLACE  Take 1 capsule (100 mg total) by mouth 2 (two) times daily.     lisinopril 20 MG tablet  Commonly known as:  PRINIVIL,ZESTRIL  Take 20 mg by mouth daily.     Magnesium 400 MG Caps  Take 1 tablet by mouth daily.     methocarbamol 500 MG tablet  Commonly known as:  ROBAXIN  Take 1 tablet (500 mg total) by mouth every 6 (six) hours as needed for muscle spasms.     metoCLOPramide 5 MG tablet  Commonly known as:  REGLAN  Take 1 tablet (5 mg total) by mouth every 8 (eight) hours as needed for nausea (if ondansetron (ZOFRAN) ineffective.).     ondansetron 4 MG tablet  Commonly known as:  ZOFRAN  Take 1 tablet (4 mg total) by  mouth every 6 (six) hours as needed for nausea.     OxyCODONE HCl ER 60 MG T12a  Take 60 mg by mouth every 12 (twelve) hours.     oxycodone 30 MG immediate release tablet  Commonly known as:  ROXICODONE  Take 1 tablet (30 mg total) by mouth every 4 (four) hours as needed for moderate pain or severe pain.     polyethylene glycol packet  Commonly known as:  MIRALAX / GLYCOLAX  Take 17 g by mouth daily as needed for mild constipation.     rivaroxaban 10 MG Tabs tablet  Commonly known as:  XARELTO  Take 1 tablet (10 mg total) by mouth daily with breakfast. Take Xarelto for two and a half more weeks, then discontinue Xarelto. Once the patient has completed the blood thinner regimen, then take a Baby 81 mg Aspirin daily for three more weeks.     traMADol 50 MG tablet  Commonly known as:  ULTRAM  Take 1-2 tablets (50-100 mg total) by mouth every 6 (six) hours as needed (mild pain).       Follow-up Information    Follow up with Gearlean Alf, MD. Schedule an appointment as soon as possible for a visit in 2 weeks.   Specialty:  Orthopedic Surgery   Why:  Call office ASAP at [phone] to setup appointment with Dr.  Wynelle Link in two weeks.   Contact information:   [address] [place] [address] [phone]       Signed: Arlee Muslim, PA-C Orthopaedic Surgery 03/01/2015, 9:01 AM

## 2015-03-02 NOTE — Progress Notes (Signed)
    Subjective: 4 Days Post-Op Procedure(s) (LRB): TOTAL LEFT HIP ARTHROPLASTY (POSTERIOR APPROACH)  (Left) Patient reports pain as 5 on 0-10 scale.   Denies CP or SOB.  Voiding without difficulty. Positive flatus. Objective: Vital signs in last 24 hours: Temp:  [97.9 F (36.6 C)-98.8 F (37.1 C)] 98.8 F (37.1 C) (06/25 0600) Pulse Rate:  [75-88] 75 (06/25 0600) Resp:  [16-20] 16 (06/25 0600) BP: (105-114)/(59-69) 105/59 mmHg (06/25 0600) SpO2:  [95 %-98 %] 95 % (06/25 0600)  Intake/Output from previous day: 06/24 0701 - 06/25 0700 In: 240 [P.O.:240] Out: 1350 [Urine:1350] Intake/Output this shift:    Labs:  Recent Labs  02/28/15 0420 03/01/15 0431  HGB 10.7* 10.9*    Recent Labs  02/28/15 0420 03/01/15 0431  WBC 14.5* 10.7*  RBC 3.82* 4.01*  HCT 32.5* 33.7*  PLT 310 298    Recent Labs  02/28/15 0420  NA 138  K 4.0  CL 103  CO2 29  BUN 7  CREATININE 0.52*  GLUCOSE 108*  CALCIUM 8.4*   No results for input(s): LABPT, INR in the last 72 hours.  Physical Exam: Neurologically intact ABD soft Neurovascular intact Intact pulses distally Incision: dressing C/D/I Compartment soft  Assessment/Plan: 4 Days Post-Op Procedure(s) (LRB): TOTAL LEFT HIP ARTHROPLASTY (POSTERIOR APPROACH)  (Left) Advance diet Up with therapy Discharge to SNF/Rehab pending  Oather Muilenburg D for Dr. Venita Lick [facility] [phone] 03/02/2015, 7:34 AM

## 2015-03-02 NOTE — Progress Notes (Signed)
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 Patient is very motivated to participate in therapies. Patient  Could benefit from CIR rehab due to complicated THA.  Follow Up Recommendations  CIR/ snf if CIR is not available,     Equipment Recommendations  None recommended by PT    Recommendations for Other Services Rehab consult     Precautions / Restrictions Precautions Precautions: Posterior Hip;Fall Precaution Booklet Issued: Yes (comment) Restrictions Weight Bearing Restrictions: Yes LLE Weight Bearing: Partial weight bearing LLE Partial Weight Bearing Percentage or Pounds: 25%    Mobility  Bed Mobility   Bed Mobility: Supine to Sit     Supine to sit: Mod assist     General bed mobility comments: extra time to get upright, support of L leg to edge of bed and down to the floor. patient is shifted to R   Transfers Overall transfer level: Needs assistance Equipment used: Rolling walker (2 wheeled) Transfers: Sit to/from Stand Sit to Stand: Mod assist;+2 safety/equipment;From elevated surface            Ambulation/Gait Ambulation/Gait assistance: Min assist;+2 safety/equipment Ambulation Distance (Feet): 15 Feet Assistive device: Rolling walker (2 wheeled) Gait Pattern/deviations: Step-to pattern;Decreased step length - right     General Gait Details: used R shoe to provide increased support and  increase height a little to  promote TDWB on LLE   Stairs            Wheelchair Mobility    Modified Rankin (Stroke Patients Only)       Balance                                    Cognition Arousal/Alertness: Awake/alert                           Exercises      General Comments        Pertinent Vitals/Pain Pain Score: 7  Pain Location: L groin/hip after walking Pain Descriptors / Indicators: Aching;Burning;Discomfort;Tightness Pain Intervention(s): Limited activity within patient's tolerance;Monitored during session;Premedicated before session;Repositioned;Ice applied    Home Living                      Prior Function            PT Goals (current goals can now be found in the care plan section) Progress towards PT goals: Progressing toward goals    Frequency  7X/week    PT Plan Discharge plan needs to be updated    Co-evaluation             End of Session   Activity Tolerance: Patient limited by pain Patient left: in chair;with call bell/phone within reach;with family/visitor present     Time: 0932-3557 PT Time Calculation (min) (ACUTE ONLY): 23 min  Charges:  $Gait Training: 23-37 mins                    G Codes:      Rada Hay 03/02/2015, 10:24 AM Blanchard Kelch PT [phone]

## 2015-03-03 NOTE — Progress Notes (Signed)
   Subjective: 5 Days Post-Op Procedure(s) (LRB): TOTAL LEFT HIP ARTHROPLASTY (POSTERIOR APPROACH)  (Left) Patient reports pain as mild.   Patient seen in rounds with Dr. Darrelyn Hillock. Patient is well, and has had no acute complaints or problems. No issues overnight. Feels that he is improving.   Objective: Vital signs in last 24 hours: Temp:  [98.3 F (36.8 C)-98.5 F (36.9 C)] 98.3 F (36.8 C) (06/26 0600) Pulse Rate:  [73-93] 73 (06/26 0600) Resp:  [16] 16 (06/26 0600) BP: (95-112)/(57-66) 99/63 mmHg (06/26 0600) SpO2:  [95 %-96 %] 95 % (06/26 0600)  Intake/Output from previous day:  Intake/Output Summary (Last 24 hours) at 03/03/15 0838 Last data filed at 03/03/15 0600  Gross per 24 hour  Intake    780 ml  Output    850 ml  Net    -70 ml     Labs:  Recent Labs  03/01/15 0431  HGB 10.9*    Recent Labs  03/01/15 0431  WBC 10.7*  RBC 4.01*  HCT 33.7*  PLT 298    EXAM General - Patient is Alert and Oriented Extremity - Neurologically intact Intact pulses distally Dorsiflexion/Plantar flexion intact Dressing/Incision - clean, dry, no drainage Motor Function - intact, moving foot and toes well on exam.   Past Medical History  Diagnosis Date  . Hypertension   . Arthritis   . Gout   . Slow urinary stream   . 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   Weight as of this encounter: 83.462 kg (184 lb). Advance diet Up with therapy  DVT Prophylaxis - Xarelto Partial weightbearing 25% left LE  Doing well. Continue PT. Hopeful for SNF placement Monday.   Dimitri Ped, PA-C Orthopaedic Surgery 03/03/2015, 8:38 AM

## 2015-03-03 NOTE — Progress Notes (Signed)
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  Follow Up Recommendations  CIR     Equipment Recommendations  None recommended by PT    Recommendations for Other Services Rehab consult     Precautions / Restrictions Precautions Precautions: Posterior Hip;Fall Precaution Booklet Issued: Yes (comment) Precaution Comments: pt recalls 2/3 THP without cues Restrictions Weight Bearing Restrictions: Yes LLE Weight Bearing: Partial weight bearing LLE Partial Weight Bearing Percentage or Pounds: 25%    Mobility  Bed Mobility Overal bed mobility: Needs Assistance Bed Mobility: Supine to Sit     Supine to sit: Mod assist     General bed mobility comments: extra time to get upright, support of L leg to edge of bed and down to the floor.  Transfers Overall transfer level: Needs assistance Equipment used: Rolling walker (2 wheeled) Transfers: Sit to/from Stand Sit to Stand: Mod assist;From elevated surface;+2 safety/equipment         General transfer comment: cues for THPs, UE/LE placement plus increased time due to pain level.  support of LLE  to lower to the floor and to lift during sitting down,  Ambulation/Gait Ambulation/Gait assistance: Min assist;+2 safety/equipment Ambulation Distance (Feet): 23 Feet Assistive device: Rolling walker (2 wheeled) Gait Pattern/deviations: Step-to pattern;Decreased step length - right;Decreased step length - left;Shuffle;Trunk flexed Gait velocity: decreased   General Gait Details: cues for posture, position from RW and foot placement.  Used R shoe for increased support and to assist with limiting wt on L LE   Stairs            Wheelchair Mobility     Modified Rankin (Stroke Patients Only)       Balance                                    Cognition Arousal/Alertness: Awake/alert Behavior During Therapy: WFL for tasks assessed/performed Overall Cognitive Status: Within Functional Limits for tasks assessed                      Exercises Total Joint Exercises Ankle Circles/Pumps: AROM;Both;15 reps;Supine Quad Sets: AROM;Both;Supine;15 reps Heel Slides: AAROM;Left;Supine;20 reps Hip ABduction/ADduction: AAROM;Left;Supine;15 reps    General Comments        Pertinent Vitals/Pain Pain Assessment: 0-10 Pain Score: 7  Pain Location: L hip Pain Descriptors / Indicators: Aching;Cramping;Sore Pain Intervention(s): Limited activity within patient's tolerance;Monitored during session;Premedicated before session;Ice applied    Home Living                      Prior Function            PT Goals (current goals can now be found in the care plan section) Acute Rehab PT Goals Patient Stated Goal: Walk without pain PT Goal Formulation: With patient Time For Goal Achievement: 03/06/15 Potential to Achieve Goals: Good Progress towards PT goals: Progressing toward goals    Frequency  7X/week    PT Plan Current plan remains appropriate    Co-evaluation             End of Session Equipment Utilized During Treatment: Gait belt Activity Tolerance: Patient tolerated  treatment well Patient left: in chair;with call bell/phone within reach;with family/visitor present     Time: 9562-1308 PT Time Calculation (min) (ACUTE ONLY): 28 min  Charges:  $Gait Training: 8-22 mins $Therapeutic Exercise: 8-22 mins                    G Codes:      Dalton Molesworth 03/04/2015, 10:51 AM

## 2015-03-04 NOTE — Progress Notes (Signed)
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  OOB on recline.  Assisted with amb in hallway limited distance due to pain level.  Max c/o swelling/discomfort.  Positioned in recliner and performed some hip TE's to tolerance followed by ICE.    Follow Up Recommendations  SNF     Equipment Recommendations  None recommended by PT    Recommendations for Other Services       Precautions / Restrictions Precautions Precautions: Posterior Hip;Fall Precaution Booklet Issued: Yes (comment) Precaution Comments: recalls 3/3 THP Restrictions Weight Bearing Restrictions: Yes LLE Weight Bearing: Partial weight bearing LLE Partial Weight Bearing Percentage or Pounds: 25%    Mobility  Bed Mobility               General bed mobility comments: Pt OOB in recliner  Transfers Overall transfer level: Needs assistance Equipment used: Rolling walker (2 wheeled) Transfers: Sit to/from Stand Sit to Stand: Mod assist         General transfer comment: VC for hand placement and importance of using arms to up   Ambulation/Gait Ambulation/Gait assistance: Min assist Ambulation Distance (Feet): 18 Feet Assistive device: Rolling walker (2 wheeled) Gait Pattern/deviations: Step-to pattern;Decreased stance time - left Gait velocity: decreased   General Gait Details: cues for posture, position from RW and foot placement.  Used R shoe for increased support and to assist with limiting wt on L LE   Stairs            Wheelchair Mobility    Modified Rankin (Stroke Patients Only)       Balance                                    Cognition Arousal/Alertness: Awake/alert Behavior During Therapy: WFL for tasks assessed/performed Overall  Cognitive Status: Within Functional Limits for tasks assessed                      Exercises   Total Hip Replacement TE's 10 reps ankle pumps 10 reps knee presses 10 reps heel slides 10 reps SAQ's 10 reps ABD Followed by ICE'     General Comments        Pertinent Vitals/Pain Pain Assessment: 0-10 Pain Score: 6  Pain Location: L hip Pain Descriptors / Indicators: Aching;Sore Pain Intervention(s): Monitored during session;Repositioned;Ice applied    Home Living                      Prior Function            PT Goals (current goals can now be found in the care plan section) Acute Rehab PT Goals Patient Stated Goal: Walk without pain Progress towards PT goals: Progressing toward goals    Frequency  7X/week    PT Plan      Co-evaluation             End of Session Equipment Utilized During Treatment: Gait belt Activity Tolerance: No increased pain Patient left: in chair;with call bell/phone within reach;with family/visitor present     Time: [id number] PT Time Calculation (min) (ACUTE ONLY): 19 min  Charges:  $Gait Training: 8-22 mins  G Codes:      Felecia Shelling  PTA  [facility]  Acute  Rehab Pager      [phone]

## 2015-03-04 NOTE — Progress Notes (Signed)
Date: March 04, 2015 Chart reviewed for concurrent status and case management needs. Carlyn Lemke, RN, BSN, CCM   [phone]  

## 2015-03-04 NOTE — Progress Notes (Signed)
Gave report to Alecia LemmingLaurie Elliott, [facility]. Left number if she had additional questions.

## 2015-03-04 NOTE — Progress Notes (Signed)
Clinical Social Work  CSW spoke with Huntley DecSara in admissions at [facility]. Huntley DecSara reports insurance authorization has been submitted and will call CSW back within 1 hour to confirm authorization. CSW will continue to follow.  [place] Lagoon[place]olly Nohealani [place], [place] [phone]

## 2015-03-04 NOTE — Progress Notes (Signed)
   Subjective: 6 Days Post-Op Procedure(s) (LRB): TOTAL LEFT HIP ARTHROPLASTY (POSTERIOR APPROACH)  (Left) Patient reports pain as mild.   Patient seen in rounds by Dr. Lequita Halt. Patient is well, and has had no acute complaints or problems Patient is ready to go the SNF if insurance approves bed.  Objective: Vital signs in last 24 hours: Temp:  [97.7 F (36.5 C)-98.4 F (36.9 C)] 98.3 F (36.8 C) (06/27 0455) Pulse Rate:  [73-93] 73 (06/27 0455) Resp:  [16] 16 (06/27 0455) BP: (103-115)/(61-81) 115/67 mmHg (06/27 0455) SpO2:  [96 %-97 %] 96 % (06/27 0455)  Intake/Output from previous day:  Intake/Output Summary (Last 24 hours) at 03/04/15 0751 Last data filed at 03/04/15 0455  Gross per 24 hour  Intake   1440 ml  Output   1000 ml  Net    440 ml    Labs: No results for input(s): HGB in the last 72 hours. No results for input(s): WBC, RBC, HCT, PLT in the last 72 hours. No results for input(s): NA, K, CL, CO2, BUN, CREATININE, GLUCOSE, CALCIUM in the last 72 hours. No results for input(s): LABPT, INR in the last 72 hours.  EXAM: General - Patient is Alert and Appropriate Extremity - Neurovascular intact Sensation intact distally Incision - clean, dry Motor Function - intact, moving foot and toes well on exam.   Assessment/Plan: 6 Days Post-Op Procedure(s) (LRB): TOTAL LEFT HIP ARTHROPLASTY (POSTERIOR APPROACH)  (Left) Procedure(s) (LRB): TOTAL LEFT HIP ARTHROPLASTY (POSTERIOR APPROACH)  (Left) Past Medical History  Diagnosis Date  . Hypertension   . Arthritis   . Gout   . Slow urinary stream   . 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   Weight as of this encounter: 83.462 kg (184 lb).  Discharge to SNF Diet - Cardiac diet Follow up - in 2  weeks Activity - PWB 25 % to the Left Leg - Do Not Advance WB Status Disposition - Skilled nursing facility - Looking into a facility in [place] close to home but have not received authorization at this time for [employer]. CSW will continue to follow to assist with d/c planning to SNF. Condition Upon Discharge - Pending at this time. Patient is stable at time of summary. D/C Meds - See DC Summary DVT Prophylaxis - Xarelto  Avel Peace, PA-C Orthopaedic Surgery 03/04/2015, 7:51 AM

## 2015-03-04 NOTE — Plan of Care (Signed)
Problem: Discharge Progression Outcomes Goal: Ambulates safely using assistive device Outcome: Adequate for Discharge 2 assist with walker Goal: Negotiates stairs Outcome: Not Met (add Reason) 25% WB to LLE Goal: Demonstrates ADLs as appropriate Outcome: Completed/Met Date Met:  03/04/15 With assist - pt going to rehab facility

## 2015-03-04 NOTE — Discharge Summary (Signed)
Physician Discharge Summary   Patient ID: Chris Drake MRN: [id number] DOB/AGE: [birth year] 57 y.o.  Admit date: 02/26/2015 Discharge date: Tentative Date of Discharge - 03/04/2015  Primary Diagnosis:  Malunion/nonunion, left intertrochanteric femur fracture  Admission Diagnoses:  Past Medical History  Diagnosis Date  . Hypertension   . Arthritis   . Gout   . Slow urinary stream   . Femur fracture    Discharge Diagnoses:   Principal Problem:   Closed fracture of neck of femur with malunion Active Problems:   OA (osteoarthritis) of hip  Estimated body mass index is 27.98 kg/(m^2) as calculated from the following:   Height as of this encounter: 5' 8" (1.727 m).   Weight as of this encounter: 83.462 kg (184 lb).  Procedure(s) (LRB): TOTAL LEFT HIP ARTHROPLASTY (POSTERIOR APPROACH)  (Left)   Consults: None  HPI: Chris Drake is a 59 year old male who had a fall with left hip pain approximately 2-1/2 months ago. I saw him in the office 2 weeks ago, noted that he had a shortened externally rotated limb. His radiographs taken that day showed a varus femoral neck with an intertrochanteric femur fracture, which was partially healed. An MRI, which he brought with him that showed the same thing. Given that it had already healed in a shortened varus position with retroversion, I felt there was no way we were going to be able to take that down and fix it with internal fixation. We discussed treatment options and elected for a total hip arthroplasty.  Laboratory Data: Admission on 02/26/2015  Component Date Value Ref Range Status  . ABO/RH(D) 02/26/2015 O NEG   Final  . Antibody Screen 02/26/2015 NEG   Final  . Sample Expiration 02/26/2015 03/01/2015   Final  . WBC 02/27/2015 10.5  4.0 - 10.5 K/uL Final  . RBC 02/27/2015 4.48  4.22 - 5.81 MIL/uL Final  . Hemoglobin 02/27/2015 12.2* 13.0 - 17.0 g/dL Final  . HCT 02/27/2015 37.7* 39.0 - 52.0 % Final  . MCV 02/27/2015 84.2  78.0  - 100.0 fL Final  . MCH 02/27/2015 27.2  26.0 - 34.0 pg Final  . MCHC 02/27/2015 32.4  30.0 - 36.0 g/dL Final  . RDW 02/27/2015 14.2  11.5 - 15.5 % Final  . Platelets 02/27/2015 320  150 - 400 K/uL Final  . Sodium 02/27/2015 137  135 - 145 mmol/L Final  . Potassium 02/27/2015 4.2  3.5 - 5.1 mmol/L Final  . Chloride 02/27/2015 100* 101 - 111 mmol/L Final  . CO2 02/27/2015 29  22 - 32 mmol/L Final  . Glucose, Bld 02/27/2015 145* 65 - 99 mg/dL Final  . BUN 02/27/2015 5* 6 - 20 mg/dL Final  . Creatinine, Ser 02/27/2015 0.59* 0.61 - 1.24 mg/dL Final  . Calcium 02/27/2015 8.7* 8.9 - 10.3 mg/dL Final  . GFR calc non Af Amer 02/27/2015 >60  >60 mL/min Final  . GFR calc Af Amer 02/27/2015 >60  >60 mL/min Final   Comment: (NOTE) The eGFR has been calculated using the CKD EPI equation. This calculation has not been validated in all clinical situations. eGFR's persistently <60 mL/min signify possible Chronic Kidney Disease.   . Anion gap 02/27/2015 8  5 - 15 Final  . WBC 02/28/2015 14.5* 4.0 - 10.5 K/uL Final  . RBC 02/28/2015 3.82* 4.22 - 5.81 MIL/uL Final  . Hemoglobin 02/28/2015 10.7* 13.0 - 17.0 g/dL Final  . HCT 02/28/2015 32.5* 39.0 - 52.0 % Final  . MCV 02/28/2015 85.1  78.0 -  100.0 fL Final  . MCH 02/28/2015 28.0  26.0 - 34.0 pg Final  . MCHC 02/28/2015 32.9  30.0 - 36.0 g/dL Final  . RDW 02/28/2015 14.7  11.5 - 15.5 % Final  . Platelets 02/28/2015 310  150 - 400 K/uL Final  . Sodium 02/28/2015 138  135 - 145 mmol/L Final  . Potassium 02/28/2015 4.0  3.5 - 5.1 mmol/L Final  . Chloride 02/28/2015 103  101 - 111 mmol/L Final  . CO2 02/28/2015 29  22 - 32 mmol/L Final  . Glucose, Bld 02/28/2015 108* 65 - 99 mg/dL Final  . BUN 02/28/2015 7  6 - 20 mg/dL Final  . Creatinine, Ser 02/28/2015 0.52* 0.61 - 1.24 mg/dL Final  . Calcium 02/28/2015 8.4* 8.9 - 10.3 mg/dL Final  . GFR calc non Af Amer 02/28/2015 >60  >60 mL/min Final  . GFR calc Af Amer 02/28/2015 >60  >60 mL/min Final    Comment: (NOTE) The eGFR has been calculated using the CKD EPI equation. This calculation has not been validated in all clinical situations. eGFR's persistently <60 mL/min signify possible Chronic Kidney Disease.   . Anion gap 02/28/2015 6  5 - 15 Final  . WBC 03/01/2015 10.7* 4.0 - 10.5 K/uL Final  . RBC 03/01/2015 4.01* 4.22 - 5.81 MIL/uL Final  . Hemoglobin 03/01/2015 10.9* 13.0 - 17.0 g/dL Final  . HCT 03/01/2015 33.7* 39.0 - 52.0 % Final  . MCV 03/01/2015 84.0  78.0 - 100.0 fL Final  . MCH 03/01/2015 27.2  26.0 - 34.0 pg Final  . MCHC 03/01/2015 32.3  30.0 - 36.0 g/dL Final  . RDW 03/01/2015 14.7  11.5 - 15.5 % Final  . Platelets 03/01/2015 298  150 - 400 K/uL Final  Hospital Outpatient Visit on 02/22/2015  Component Date Value Ref Range Status  . aPTT 02/22/2015 34  24 - 37 seconds Final  . WBC 02/22/2015 9.4  4.0 - 10.5 K/uL Final  . RBC 02/22/2015 5.06  4.22 - 5.81 MIL/uL Final  . Hemoglobin 02/22/2015 14.2  13.0 - 17.0 g/dL Final  . HCT 02/22/2015 43.7  39.0 - 52.0 % Final  . MCV 02/22/2015 86.4  78.0 - 100.0 fL Final  . MCH 02/22/2015 28.1  26.0 - 34.0 pg Final  . MCHC 02/22/2015 32.5  30.0 - 36.0 g/dL Final  . RDW 02/22/2015 14.6  11.5 - 15.5 % Final  . Platelets 02/22/2015 344  150 - 400 K/uL Final  . Sodium 02/22/2015 139  135 - 145 mmol/L Final  . Potassium 02/22/2015 4.3  3.5 - 5.1 mmol/L Final  . Chloride 02/22/2015 96* 101 - 111 mmol/L Final  . CO2 02/22/2015 34* 22 - 32 mmol/L Final  . Glucose, Bld 02/22/2015 105* 65 - 99 mg/dL Final  . BUN 02/22/2015 6  6 - 20 mg/dL Final  . Creatinine, Ser 02/22/2015 0.67  0.61 - 1.24 mg/dL Final  . Calcium 02/22/2015 9.4  8.9 - 10.3 mg/dL Final  . Total Protein 02/22/2015 7.7  6.5 - 8.1 g/dL Final  . Albumin 02/22/2015 3.6  3.5 - 5.0 g/dL Final  . AST 02/22/2015 22  15 - 41 U/L Final  . ALT 02/22/2015 21  17 - 63 U/L Final  . Alkaline Phosphatase 02/22/2015 146* 38 - 126 U/L Final  . Total Bilirubin 02/22/2015 0.1* 0.3 -  1.2 mg/dL Final  . GFR calc non Af Amer 02/22/2015 >60  >60 mL/min Final  . GFR calc Af Amer 02/22/2015 >60  >  60 mL/min Final   Comment: (NOTE) The eGFR has been calculated using the CKD EPI equation. This calculation has not been validated in all clinical situations. eGFR's persistently <60 mL/min signify possible Chronic Kidney Disease.   . Anion gap 02/22/2015 9  5 - 15 Final  . Prothrombin Time 02/22/2015 12.7  11.6 - 15.2 seconds Final  . INR 02/22/2015 0.93  0.00 - 1.49 Final  . ABO/RH(D) 02/22/2015 O NEG   Final  . Antibody Screen 02/22/2015 NEG   Final  . Sample Expiration 02/22/2015 03/01/2015   Final  . Color, Urine 02/22/2015 YELLOW  YELLOW Final  . APPearance 02/22/2015 CLEAR  CLEAR Final  . Specific Gravity, Urine 02/22/2015 1.012  1.005 - 1.030 Final  . pH 02/22/2015 7.0  5.0 - 8.0 Final  . Glucose, UA 02/22/2015 NEGATIVE  NEGATIVE mg/dL Final  . Hgb urine dipstick 02/22/2015 NEGATIVE  NEGATIVE Final  . Bilirubin Urine 02/22/2015 NEGATIVE  NEGATIVE Final  . Ketones, ur 02/22/2015 NEGATIVE  NEGATIVE mg/dL Final  . Protein, ur 02/22/2015 NEGATIVE  NEGATIVE mg/dL Final  . Urobilinogen, UA 02/22/2015 1.0  0.0 - 1.0 mg/dL Final  . Nitrite 02/22/2015 NEGATIVE  NEGATIVE Final  . Leukocytes, UA 02/22/2015 NEGATIVE  NEGATIVE Final   MICROSCOPIC NOT DONE ON URINES WITH NEGATIVE PROTEIN, BLOOD, LEUKOCYTES, NITRITE, OR GLUCOSE <1000 mg/dL.  Marland Kitchen MRSA, PCR 02/22/2015 NEGATIVE  NEGATIVE Final  . Staphylococcus aureus 02/22/2015 POSITIVE* NEGATIVE Final   Comment:        The Xpert SA Assay (FDA approved for NASAL specimens in patients over 81 years of age), is one component of a comprehensive surveillance program.  Test performance has been validated by [facility] for patients greater than or equal to 53 year old. It is not intended to diagnose infection nor to guide or monitor treatment.   . ABO/RH(D) 02/22/2015 O NEG   Final     X-Rays:Dg Chest 2 View  02/28/2015    CLINICAL DATA:  Evaluate for TB  EXAM: CHEST  2 VIEW  COMPARISON:  None  FINDINGS: Exam detail is diminished due to kyphoscoliosis deformity and hypo inflation of the lungs. There is scar versus platelike atelectasis within the left base. No airspace consolidation identified. No calcified granulomas or lymph nodes identified.  IMPRESSION: 1. Exam detail is diminished as above. 2. No active cardiopulmonary abnormalities.   Electronically Signed   By: Kerby Moors M.D.   On: 02/28/2015 10:55   Dg Pelvis Portable  02/26/2015   CLINICAL DATA:  LEFT hip surgery.  Postoperative radiographs.  EXAM: PORTABLE PELVIS 1-2 VIEWS  COMPARISON:  None.  FINDINGS: New LEFT total hip arthroplasty with expected postsurgical changes in the soft tissues. The LEFT hip appears located. There is lucency adjacent to the tip of the femoral stem, suspicious for a nondisplaced fracture of the femoral shaft. Bones appear osteopenic.  IMPRESSION: 1. New LEFT total hip arthroplasty. 2. Lucency around the tip of the femoral stem suspicious for fracture. 3. These results will be called to the ordering clinician or representative by the Radiologist Assistant, and communication documented in the PACS or zVision Dashboard.   Electronically Signed   By: Dereck Ligas M.D.   On: 02/26/2015 19:14   Mr Knee Left  Wo Contrast  02/06/2015   CLINICAL DATA:  Patient slid off the bed while staying at the [facility] in [place], [place] on December 17, 2014. His left knee, leg was bent underneath him. Cannot weight bear. The left lower leg  is very swollen, slightly red and "shinny" looking. The incident occurred around 7:00 pm. Patient is in much pain.  EXAM: MRI OF THE LEFT KNEE WITHOUT CONTRAST  TECHNIQUE: Multiplanar, multisequence MR imaging of the knee was performed. No intravenous contrast was administered.  COMPARISON:  None.  FINDINGS: There is extensive subcutaneous edema surrounding the knee, greatest anteriorly. There is possible fluid in  the prepatellar bursa.  MENISCI  Medial meniscus:  Intact with normal morphology.  Lateral meniscus:  Discoid morphology without evidence of tear.  LIGAMENTS  Cruciates:  Intact.  Collaterals:  Intact.  CARTILAGE  Patellofemoral:  Preserved.  Medial:  Mild chondral thinning.  No focal chondral defect.  Lateral:  Preserved.  OTHER  Joint:  No significant joint effusion.  Popliteal Fossa:  Unremarkable. No significant Baker's cyst.  Extensor Mechanism:  Intact.  Bones:  No significant extra-articular osseous findings.  IMPRESSION: 1. Prominent subcutaneous edema surrounding the knee with more focal component anteriorly suspicious for prepatellar bursitis. The generalized edema is nonspecific and may reflect cellulitis, lymphedema or venous insufficiency. 2. Mild medial compartment degenerative chondrosis. No acute osseous findings. 3. Intact menisci, cruciate and collateral ligaments.   Electronically Signed   By: Richardean Sale M.D.   On: 02/06/2015 14:19   Mr Hip Left Wo Contrast  02/06/2015   CLINICAL DATA:  Left hip and leg and knee pain since a fall on 12/17/2014 . Left lower leg swelling and redness.  EXAM: MR OF THE LEFT HIP WITHOUT CONTRAST  TECHNIQUE: Multiplanar, multisequence MR imaging was performed. No intravenous contrast was administered.  COMPARISON:  None.  FINDINGS: Bones: There is an impacted intertrochanteric/low neck fracture of the proximal left femur with angulation. This is subacute. There is edema in the adjacent soft tissues.  There is also focal edema in the inferior pubic ramus adjacent to the ischial tuberosity as well slight edema in the left pubic body and in the adjacent adductor musculature on the left. The visualized portion of the sacrum appears normal. There are moderate arthritic changes of the right hip.  Articular cartilage and labrum  Articular cartilage: There is diffuse thinning of the articular cartilage with superior joint space narrowing.  Labrum:  There is a small focal  anterior superior labral tear.  Joint or bursal effusion  Joint effusion:  Small hip effusion.  Bursae:  No bursitis.  Muscles and tendons  Muscles and tendons: Slight atrophy of the left gluteal muscles. Edema in the subcutaneous soft tissues lateral to the left hip with a small seroma superficial to the fascia at the posterior lateral aspect of the left hip.  Other findings  Miscellaneous:  Tiny right posterior lateral bladder diverticulum.  IMPRESSION: Impacted slightly angulated subacute low neck/intertrochanteric fracture of the proximal left femur. Critical Value/emergent results were called by telephone at the time of interpretation on 02/06/2015 at 1:53 pm to [facility] PA, who verbally acknowledged these results.   Electronically Signed   By: Lorriane Shire M.D.   On: 02/06/2015 13:53    EKG: Orders placed or performed during the hospital encounter of 02/22/15  . EKG 12-Lead  . EKG 12-Lead     Hospital Course: Patient was admitted to [facility] and taken to the OR and underwent the above state procedure without complications.  Patient tolerated the procedure well and was later transferred to the recovery room and then to the orthopaedic floor for postoperative care.  They were given PO and IV analgesics for pain control following their surgery.  They were given 24 hours of postoperative antibiotics of  Anti-infectives    Start     Dose/Rate Route Frequency Ordered Stop   02/27/15 0600  ceFAZolin (ANCEF) IVPB 2 g/50 mL premix     2 g 100 mL/hr over 30 Minutes Intravenous On call to O.R. 02/26/15 1351 02/26/15 1655   02/26/15 2300  ceFAZolin (ANCEF) IVPB 2 g/50 mL premix     2 g 100 mL/hr over 30 Minutes Intravenous Every 6 hours 02/26/15 1957 02/27/15 0611     and started on DVT prophylaxis in the form of Xarelto.   PT and OT were ordered for total hip protocol.  The patient was allowed to be WBAT with therapy. Discharge planning was consulted to help with postop disposition  and equipment needs.  Patient had a tough night on the evening of surgery.  They started to get up OOB with therapy on day one.  Hemovac drain was pulled without difficulty.  The knee immobilizer was removed and discontinued.  Continued to work with therapy into day two.  Dressing was changed on day two and the incision was healing well.  By day three, the patient had progressed with therapy and meeting their goals.  Incision was healing well.  Patient was seen in rounds and was ready to go to the SNF. Unfortunately, insurance did not come through and the patient was kept through the weekend to continue to receive close observation and therapy on Saturday and Sunday. He was slowing progressing with therapy gradually increasing his mobility. He was seen again on Monday by Dr. Wynelle Link and at the time of morning rounds, still waiting for approval from [facility].  Setup for discharge pending insurance approval.   Diet: Cardiac diet Activity:  PWB 25 % to the Left Leg Do Not Advance WB Status No bending hip over 90 degrees- A "L" Angle Do not cross legs Do not let foot roll inward When turning these patients a pillow should be placed between the patient's legs to prevent crossing. Patients should have the affected knee fully extended when trying to sit or stand from all surfaces to prevent excessive hip flexion. When ambulating and turning toward the affected side the affected leg should have the toes turned out prior to moving the walker and the rest of patient's body as to prevent internal rotation/ turning in of the leg. Abduction pillows are the most effective way to prevent a patient from not crossing legs or turning toes in at rest. If an abduction pillow is not ordered placing a regular pillow length wise between the patient's legs is also an effective reminder. It is imperative that these precautions be maintained so that the surgical hip does not dislocate. Follow-up:in 2 weeks from time of  surgery Disposition - [facility] into a facility in [place] close to home but have not received authorization at this time for [employer]. Discharged Condition: Pending at time of summary but ready for transfer if approval comes from insurance.   Discharge Instructions    Call MD / Call 911    Complete by:  As directed   If you experience chest pain or shortness of breath, CALL 911 and be transported to the hospital emergency room.  If you develope a fever above 101 F, pus (white drainage) or increased drainage or redness at the wound, or calf pain, call your surgeon's office.     Change dressing    Complete by:  As directed   You  may change your dressing dressing daily with sterile 4 x 4 inch gauze dressing and paper tape.  Do not submerge the incision under water.     Constipation Prevention    Complete by:  As directed   Drink plenty of fluids.  Prune juice may be helpful.  You may use a stool softener, such as Colace (over the counter) 100 mg twice a day.  Use MiraLax (over the counter) for constipation as needed.     Diet - low sodium heart healthy    Complete by:  As directed      Discharge instructions    Complete by:  As directed   Pick up stool softner and laxative for home use following surgery while on pain medications. Do not submerge incision under water. Please use good hand washing techniques while changing dressing each day. May shower starting three days after surgery. Please use a clean towel to pat the incision dry following showers. Continue to use ice for pain and swelling after surgery. Do not use any lotions or creams on the incision until instructed by your surgeon. Hip precautions.  Total Hip Protocol.  Take Xarelto for two and a half more weeks, then discontinue Xarelto. Once the patient has completed the blood thinner regimen, then take a Baby 81 mg Aspirin daily for three more weeks.  Postoperative Constipation Protocol  Constipation -  defined medically as fewer than three stools per week and severe constipation as less than one stool per week.  One of the most common issues patients have following surgery is constipation.  Even if you have a regular bowel pattern at home, your normal regimen is likely to be disrupted due to multiple reasons following surgery.  Combination of anesthesia, postoperative narcotics, change in appetite and fluid intake all can affect your bowels.  In order to avoid complications following surgery, here are some recommendations in order to help you during your recovery period.  Colace (docusate) - Pick up an over-the-counter form of Colace or another stool softener and take twice a day as long as you are requiring postoperative pain medications.  Take with a full [place] of water daily.  If you experience loose stools or diarrhea, hold the colace until you stool forms back up.  If your symptoms do not get better within 1 week or if they get worse, check with your doctor.  Dulcolax (bisacodyl) - Pick up over-the-counter and take as directed by the product packaging as needed to assist with the movement of your bowels.  Take with a full [place] of water.  Use this product as needed if not relieved by Colace only.   MiraLax (polyethylene glycol) - Pick up over-the-counter to have on hand.  MiraLax is a solution that will increase the amount of water in your bowels to assist with bowel movements.  Take as directed and can mix with a [place] of water, juice, soda, coffee, or tea.  Take if you go more than two days without a movement. Do not use MiraLax more than once per day. Call your doctor if you are still constipated or irregular after using this medication for 7 days in a row.  If you continue to have problems with postoperative constipation, please contact the office for further assistance and recommendations.  If you experience "the worst abdominal pain ever" or develop nausea or vomiting, please contact the  office immediatly for further recommendations for treatment.   When discharged from the skilled rehab facility, please have the  facility set up the patient's [place] prior to being released.  Please make sure this gets set up prior to release in order to avoid any lapse of therapy following the rehab stay.  Also provide the patient with their medications at time of release from the facility to include their pain medication, the muscle relaxants, and their blood thinner medication.  If the patient is still at the rehab facility at time of follow up appointment, please also assist the patient in arranging follow up appointment in our office and any transportation needs. ICE to the affected knee or hip every three hours for 30 minutes at a time and then as needed for pain and swelling.  PARTIAL WEIGHT BEARING 25% TO LEFT LEG - DO NOT ADVANCE WB STATUS.     Do not sit on low chairs, stoools or toilet seats, as it may be difficult to get up from low surfaces    Complete by:  As directed      Driving restrictions    Complete by:  As directed   No driving until released by the physician.     Follow the hip precautions as taught in Physical Therapy    Complete by:  As directed      Increase activity slowly as tolerated    Complete by:  As directed      Lifting restrictions    Complete by:  As directed   No lifting until released by the physician.     Partial weight bearing    Complete by:  As directed   % Body Weight:  25%  Laterality:  left  Extremity:  Lower     Patient may shower    Complete by:  As directed   You may shower without a dressing once there is no drainage.  Do not wash over the wound.  If drainage remains, do not shower until drainage stops.     TED hose    Complete by:  As directed   Use stockings (TED hose) for 3 weeks on both leg(s).  You may remove them at night for sleeping.            Medication List    STOP taking these medications         CALCIUM + D PO     FISH OIL PO     NON FORMULARY      TAKE these medications        acetaminophen 325 MG tablet  Commonly known as:  TYLENOL  Take 2 tablets (650 mg total) by mouth every 6 (six) hours as needed for mild pain (or Fever >/= 101).     bisacodyl 10 MG suppository  Commonly known as:  DULCOLAX  Place 1 suppository (10 mg total) rectally daily as needed for mild constipation or moderate constipation.     cetirizine 10 MG tablet  Commonly known as:  ZYRTEC  Take 10 mg by mouth daily.     docusate sodium 100 MG capsule  Commonly known as:  COLACE  Take 1 capsule (100 mg total) by mouth 2 (two) times daily.     lisinopril 20 MG tablet  Commonly known as:  PRINIVIL,ZESTRIL  Take 20 mg by mouth daily.     Magnesium 400 MG Caps  Take 1 tablet by mouth daily.     methocarbamol 500 MG tablet  Commonly known as:  ROBAXIN  Take 1 tablet (500 mg total) by mouth every 6 (six) hours  as needed for muscle spasms.     metoCLOPramide 5 MG tablet  Commonly known as:  REGLAN  Take 1 tablet (5 mg total) by mouth every 8 (eight) hours as needed for nausea (if ondansetron (ZOFRAN) ineffective.).     ondansetron 4 MG tablet  Commonly known as:  ZOFRAN  Take 1 tablet (4 mg total) by mouth every 6 (six) hours as needed for nausea.     OxyCODONE HCl ER 60 MG T12a  Take 60 mg by mouth every 12 (twelve) hours.     oxycodone 30 MG immediate release tablet  Commonly known as:  ROXICODONE  Take 1 tablet (30 mg total) by mouth every 4 (four) hours as needed for moderate pain or severe pain.     polyethylene glycol packet  Commonly known as:  MIRALAX / GLYCOLAX  Take 17 g by mouth daily as needed for mild constipation.     rivaroxaban 10 MG Tabs tablet  Commonly known as:  XARELTO  Take 1 tablet (10 mg total) by mouth daily with breakfast. Take Xarelto for two and a half more weeks, then discontinue Xarelto. Once the patient has completed the blood thinner regimen, then take a Baby  81 mg Aspirin daily for three more weeks.     traMADol 50 MG tablet  Commonly known as:  ULTRAM  Take 1-2 tablets (50-100 mg total) by mouth every 6 (six) hours as needed (mild pain).           Follow-up Information    Follow up with Gearlean Alf, MD. Schedule an appointment as soon as possible for a visit in 2 weeks.   Specialty:  Orthopedic Surgery   Why:  Call office ASAP at [phone] to setup appointment with Dr. Wynelle Link in two weeks.   Contact information:   [address] [place] [address] [phone]       Signed: Arlee Muslim, PA-C Orthopaedic Surgery 03/04/2015, 7:53 AM

## 2015-03-04 NOTE — Clinical Social Work Placement (Signed)
   CLINICAL SOCIAL WORK PLACEMENT  NOTE  Date:  03/04/2015  Patient Details  Name: Chris Drake MRN: [id number] Date of Birth: 09/18/1955  Clinical Social Work is seeking post-discharge placement for this patient at the Skilled  Nursing Facility level of care (*CSW will initial, date and re-position this form in  chart as items are completed):  Yes   Patient/family provided with [place] Clinical Social Work Department's list of facilities offering this level of care within the geographic area requested by the patient (or if unable, by the patient's family).  Yes   Patient/family informed of their freedom to choose among providers that offer the needed level of care, that participate in Medicare, Medicaid or managed care program needed by the patient, have an available bed and are willing to accept the patient.  Yes   Patient/family informed of [facility]'s ownership interest in [facility] and [facility], as well as of the fact that they are under no obligation to receive care at these facilities.  PASRR submitted to EDS on       PASRR number received on       Existing PASRR number confirmed on       FL2 transmitted to all facilities in geographic area requested by pt/family on 02/27/15     FL2 transmitted to all facilities within larger geographic area on       Patient informed that his/her managed care company has contracts with or will negotiate with certain facilities, including the following:        Yes   Patient/family informed of bed offers received.  [facility]     Physician recommends and patient chooses bed at      [facility].  Patient to be transferred to facility by [facility]     Patient family notified on 03/04/15 of transfer.  Name of family member notified:  mom     PHYSICIAN       Additional Comment:     _______________________________________________ Marnee SpringGerber, [place] Marie, LCSW 03/04/2015, 4:08 PM

## 2015-03-04 NOTE — Progress Notes (Signed)
Clinical Social Work  [facility] for [facility]F placement. CSW informed patient and mom of DC plans. Patient happy to be close to home for rehab. CSW prepared DC packet with FL2, hard scripts and DC summary included. CSW arranged transportation via [facility]. CSW is signing off but available if needed.  [place]olly Lind[place], [place] [phone]

## 2015-03-04 NOTE — Progress Notes (Signed)
Occupational Therapy Treatment Patient Details Name: Chris Drake MRN: [id number] DOB: 1955/09/11 Today's Date: 03/04/2015          Follow Up Recommendations  SNF    Equipment Recommendations  None recommended by OT (has 3:1)    Recommendations for Other Services      Precautions / Restrictions Precautions Precautions: Posterior Hip;Fall Precaution Booklet Issued: Yes (comment) Precaution Comments: recalls 3/3 THP Restrictions Weight Bearing Restrictions: Yes LLE Weight Bearing: Partial weight bearing LLE Partial Weight Bearing Percentage or Pounds: 25%       Mobility Bed Mobility               General bed mobility comments: pt sitting in chair  Transfers Overall transfer level: Needs assistance     Sit to Stand: Mod assist         General transfer comment: VC for hand placement and importance of using arms to up     Balance                                   ADL       Grooming: Wash/dry hands;Set up;Sitting       Lower Body Bathing: Maximal assistance;Sit to/from stand         Lower Body Dressing Details (indicate cue type and reason): AE education with THP.                General ADL Comments: reviewed THPs in regarding to dressing and bathing as well as functional moblity                Cognition   Behavior During Therapy: WFL for tasks assessed/performed Overall Cognitive Status: Within Functional Limits for tasks assessed                               General Comments      Pertinent Vitals/ Pain       Pain Score: 5  Pain Location: l hip Pain Descriptors / Indicators: Aching Pain Intervention(s): Monitored during session     Prior Functioning/Environment              Frequency Min 2X/week     Progress Toward Goals  OT Goals(current goals can now be found in the care plan section)     Acute Rehab OT Goals Patient Stated Goal: Walk without pain  Plan ST SNF      End of  Session     Activity Tolerance Patient tolerated treatment well   Patient Left in chair;with call bell/phone within reach           Time: 1135-1150 OT Time Calculation (min): 15 min  Charges: OT General Charges $OT Visit: 1 Procedure OT Treatments $Self Care/Home Management : 8-22 mins  Jetson Pickrel, [place] KungLorraine D 03/04/2015, 12:24 PM

## 2015-03-04 NOTE — Progress Notes (Signed)
Received prescreen request for inpatient rehab from PT and have reviewed pt's case. I have noted per DC summary that the plan is for SNF closer to home in [place] and that case manager is waiting for insurance authorization for SNF. Pt has BCBS and it is not likely that insurance would give authorization for inpatient rehab based on current diagnosis. We recommend that SNF be pursued and will defer to current plans from case manager. Please contact me with any questions. Thanks.  Juliann Mule, PT Rehabilitation Admissions Coordinator [phone]

## 2015-03-04 NOTE — Progress Notes (Signed)
   03/02/15 1547  PT Visit Information-Late entry for 6/25.  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  PT Time Calculation  PT Start Time (ACUTE ONLY) 1322  PT Stop Time (ACUTE ONLY) 1350  PT Time Calculation (min) (ACUTE ONLY) 28 min  Precautions  Precautions Posterior Hip  Restrictions  LLE Weight Bearing PWB  Pain Assessment  Pain Score 6  Pain Location L hip (L hip)  Pain Descriptors / Indicators Aching  Pain Intervention(s) Monitored during session;Premedicated before session;Repositioned  Cognition  Arousal/Alertness Awake/alert  Transfers  Overall transfer level Needs assistance  Transfers Sit to/from Stand  Stand pivot transfers Mod assist;+2 safety/equipment  General transfer comment cues for THPs, UE/LE placement plus increased time due to pain level.  support of LLE  to lower to the floor and to lift during sitting down,  Ambulation/Gait  Assistive device Rolling walker (2 wheeled)  General Gait Details used R shoe to provide increased support and  increase height a little to  promote TDWB on R  Total Joint Exercises  Ankle Circles/Pumps AROM;Both;15 reps;Supine  Quad Sets AROM;Both;Supine;15 reps  Heel Slides AAROM;Left;Supine;10 reps  PT - End of Session  Activity Tolerance Patient tolerated treatment well  Patient left in chair;with call bell/phone within reach;with family/visitor present  Nurse Communication Mobility status  PT - Assessment/Plan  PT Plan Current plan remains appropriate  PT Frequency (ACUTE ONLY) 7X/week  Recommendations for Other Services Rehab consult  Follow Up Recommendations CIR  PT Goal Progression  Progress towards PT goals Progressing toward goals  PT General Charges  $$ ACUTE PT VISIT 1 Procedure  PT Treatments  $Gait Training 8-22 mins  $Therapeutic Exercise 8-22 mins  TremontKaren Aldon Drake PT [phone]

## 2015-03-22 ENCOUNTER — Other Ambulatory Visit (HOSPITAL_COMMUNITY): Payer: Self-pay | Admitting: Internal Medicine

## 2015-03-22 ENCOUNTER — Ambulatory Visit (HOSPITAL_COMMUNITY)
Admission: RE | Admit: 2015-03-22 | Discharge: 2015-03-22 | Disposition: A | Discharge status: Home / Self Care | Payer: BLUE CROSS/BLUE SHIELD | Source: Ambulatory Visit | Attending: Internal Medicine | Admitting: Internal Medicine

## 2015-03-22 DIAGNOSIS — R600 Localized edema: Secondary | ICD-10-CM

## 2015-03-22 DIAGNOSIS — M79661 Pain in right lower leg: Secondary | ICD-10-CM | POA: Diagnosis not present

## 2015-03-22 DIAGNOSIS — M79606 Pain in leg, unspecified: Secondary | ICD-10-CM

## 2015-03-22 DIAGNOSIS — M79605 Pain in left leg: Secondary | ICD-10-CM

## 2015-03-22 DIAGNOSIS — M79662 Pain in left lower leg: Secondary | ICD-10-CM | POA: Diagnosis not present

## 2015-03-22 DIAGNOSIS — M79604 Pain in right leg: Secondary | ICD-10-CM

## 2015-04-11 ENCOUNTER — Telehealth: Payer: Self-pay

## 2015-04-11 NOTE — Telephone Encounter (Signed)
LMOM to call back

## 2017-04-25 IMAGING — MR MR KNEE*L* W/O CM
5 of 6 series · 31 of 40 positions shown · non-contrast
Comparison: None.

CLINICAL DATA: Patient slid off the bed while staying at the
[facility] in [place] on December 17, 2014. His left knee,
leg was bent underneath him. Cannot weight bear. The left lower leg
is very swollen, slightly red and "shinny" looking. The incident
occurred around [DATE]. Patient is in much pain.

EXAM:
MRI OF THE LEFT KNEE WITHOUT CONTRAST
TECHNIQUE: Multiplanar, multisequence MR imaging of the knee was performed. No
intravenous contrast was administered.

[Series 5: pdfs axial blade · axial · 3.0mm · 0.70mm/px · z∈[-56,+47]mm · 7 of 30 slices shown]
[im 1/30]
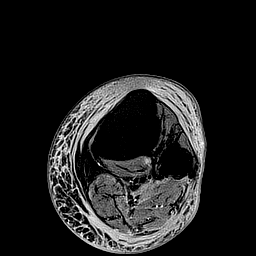
[im 5/30]
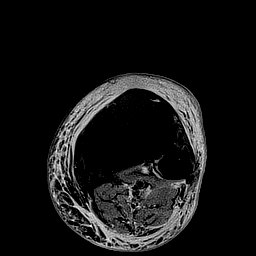
[im 10/30]
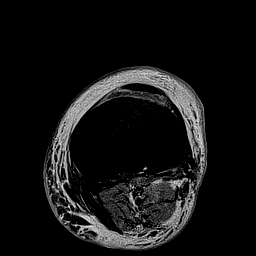
[im 15/30]
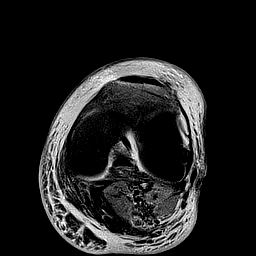
[im 20/30]
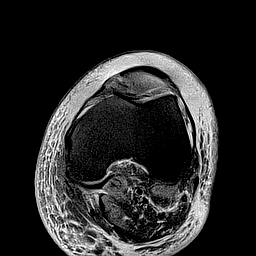
[im 25/30]
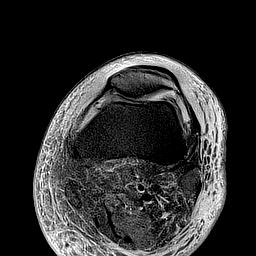
[im 30/30]
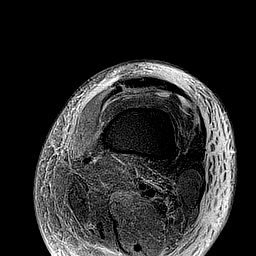

[Series 6: T1 · oblique · 3.0mm · 0.20mm/px · 7 of 26 slices shown]
[im 1/26]
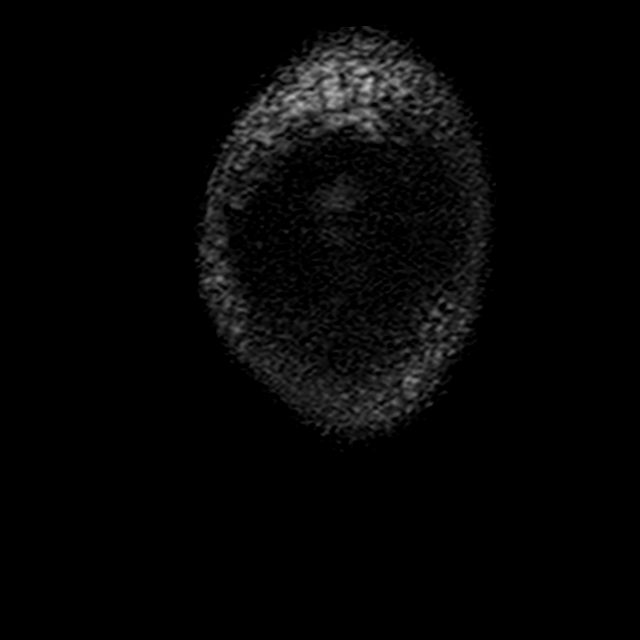
[im 5/26]
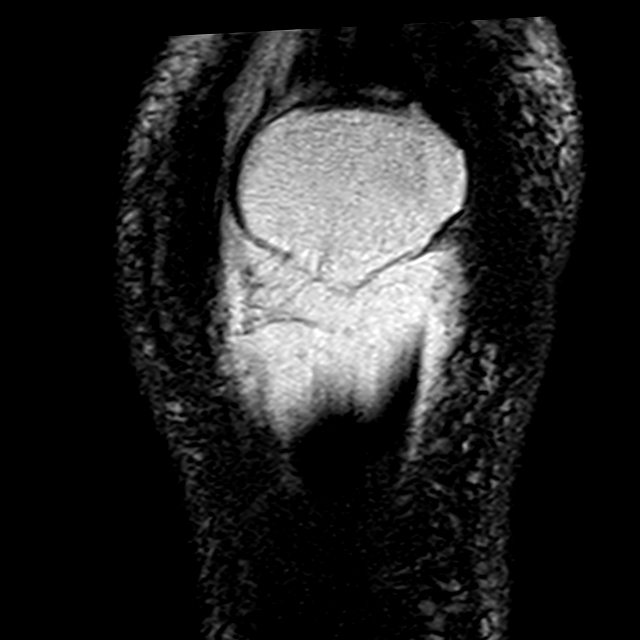
[im 9/26]
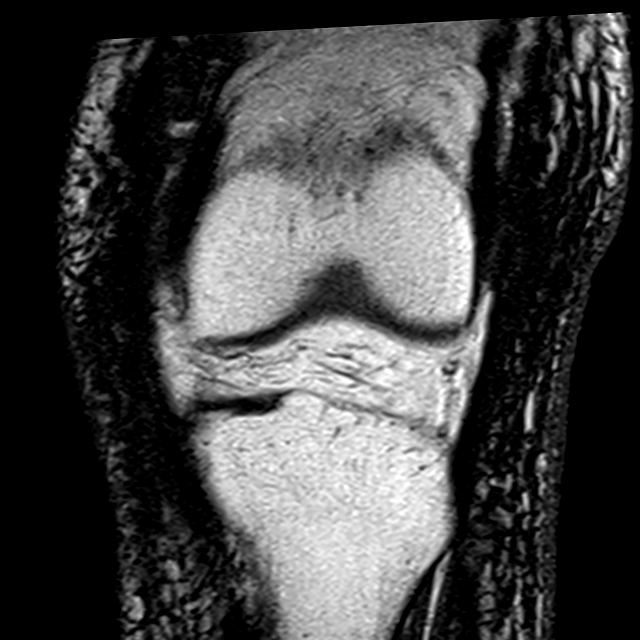
[im 13/26]
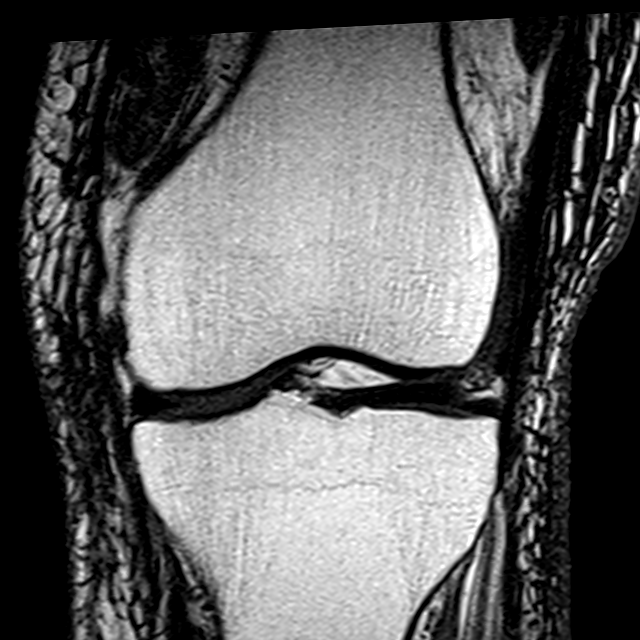
[im 17/26]
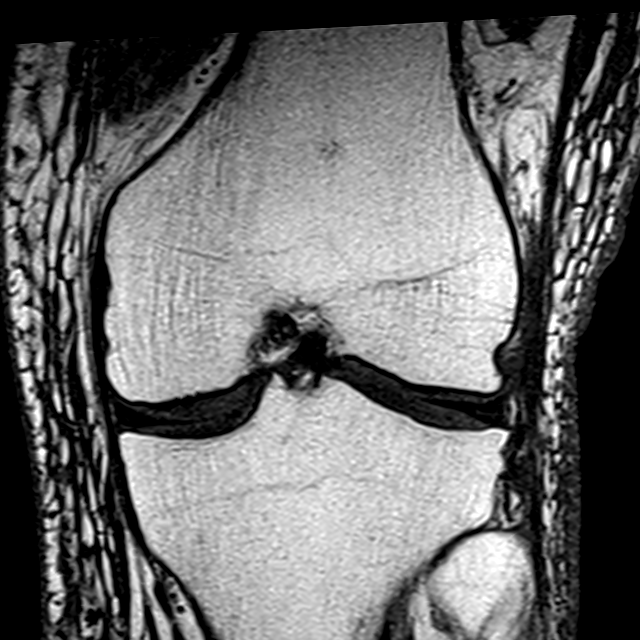
[im 21/26]
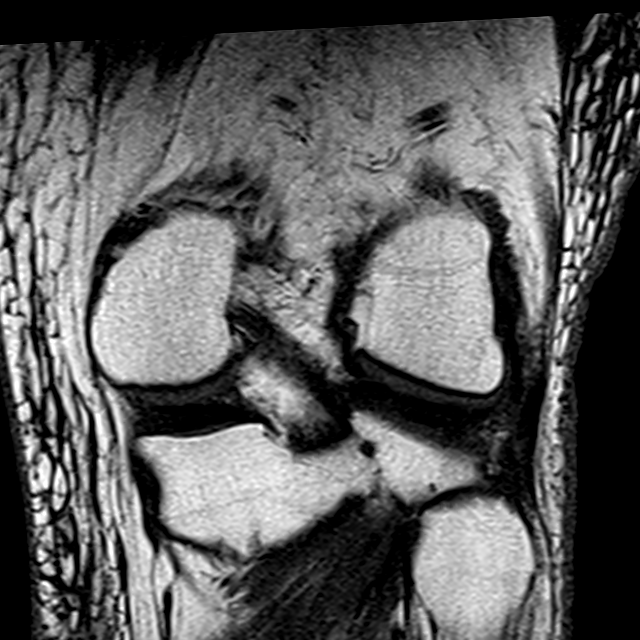
[im 26/26]
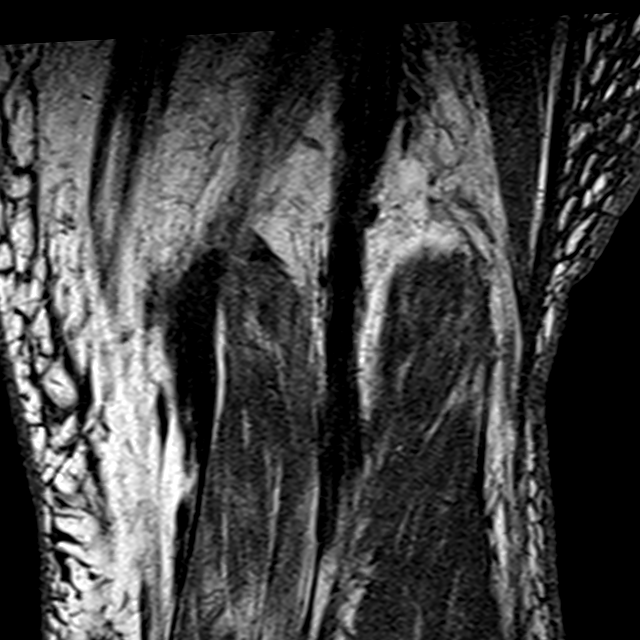

[Series 9: pdfs sag blade · oblique · 3.0mm · 0.39mm/px · 8 of 29 slices shown]
[im 1/29]
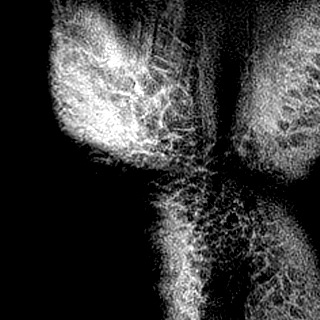
[im 5/29]
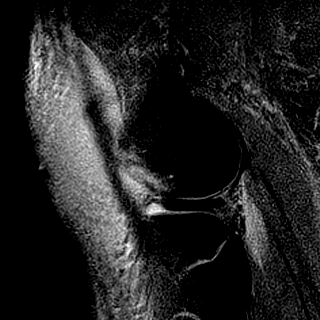
[im 9/29]
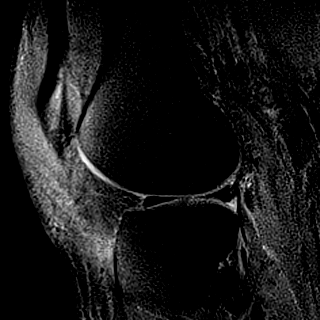
[im 13/29]
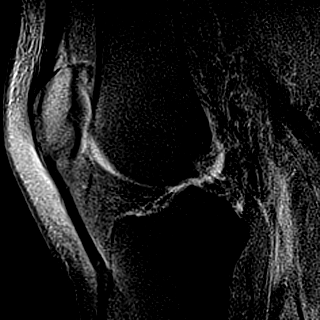
[im 17/29]
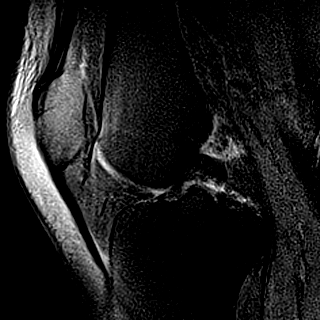
[im 21/29]
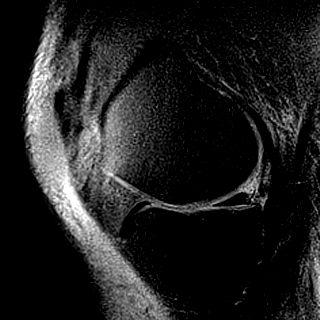
[im 25/29]
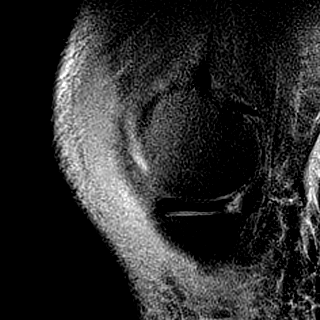
[im 29/29]
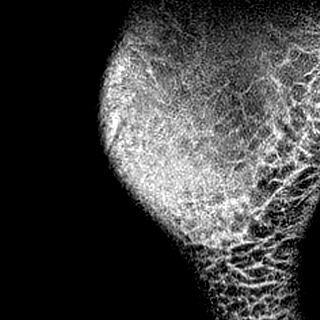

[Series 10: t2fs cor blade · oblique · 3.0mm · 0.45mm/px · 7 of 26 slices shown]
[im 1/26]
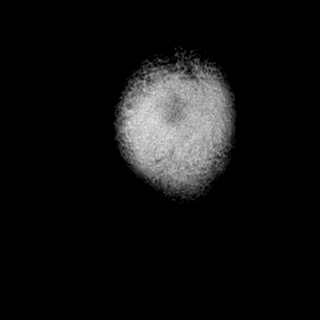
[im 5/26]
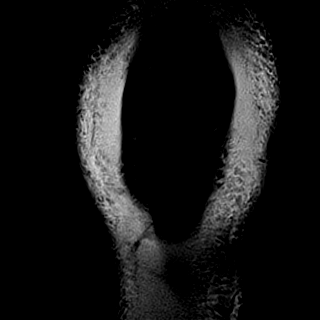
[im 9/26]
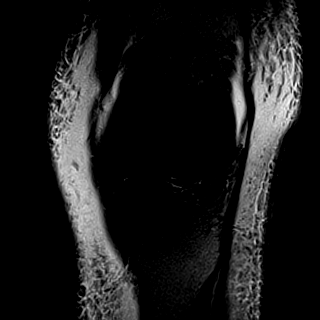
[im 13/26]
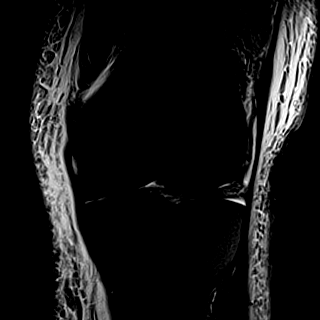
[im 17/26]
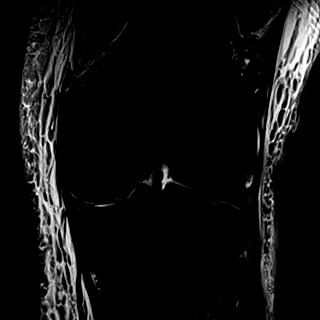
[im 21/26]
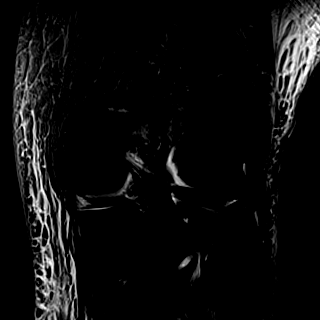
[im 26/26]
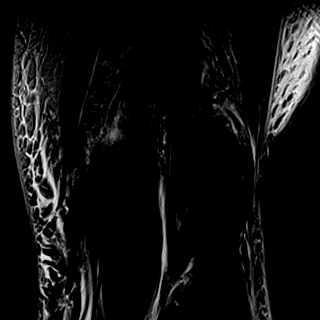

[Series 11: pdfs cor blade · oblique · 3.0mm · 0.56mm/px · 2 of 26 slices shown]
[im 1/26]
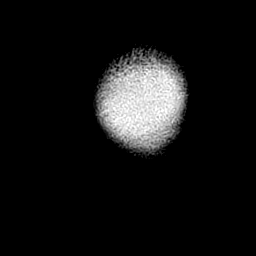
[im 5/26]
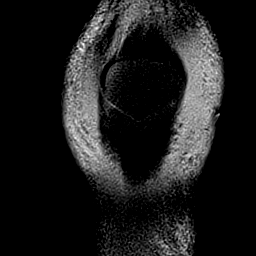

[31 of 40 positions shown; findings below may reference images not displayed]

FINDINGS: There is extensive subcutaneous edema surrounding the knee, greatest
anteriorly. There is possible fluid in the prepatellar bursa.

MENISCI

Medial meniscus:  Intact with normal morphology.

Lateral meniscus:  Discoid morphology without evidence of tear.

LIGAMENTS

Cruciates:  Intact.

Collaterals:  Intact.

CARTILAGE

Patellofemoral:  Preserved.

Medial:  Mild chondral thinning.  No focal chondral defect.

Lateral:  Preserved.

OTHER

Joint:  No significant joint effusion.

Popliteal Fossa:  Unremarkable. No significant Baker's cyst.

Extensor Mechanism:  Intact.

Bones:  No significant extra-articular osseous findings.
IMPRESSION: 1. Prominent subcutaneous edema surrounding the knee with more focal
component anteriorly suspicious for prepatellar bursitis. The
generalized edema is nonspecific and may reflect cellulitis,
lymphedema or venous insufficiency.
2. Mild medial compartment degenerative chondrosis. No acute osseous
findings.
3. Intact menisci, cruciate and collateral ligaments.

## 2017-07-28 DIAGNOSIS — G894 Chronic pain syndrome: Secondary | ICD-10-CM | POA: Diagnosis not present

## 2017-07-28 DIAGNOSIS — E785 Hyperlipidemia, unspecified: Secondary | ICD-10-CM | POA: Diagnosis not present

## 2017-07-28 DIAGNOSIS — Z6829 Body mass index (BMI) 29.0-29.9, adult: Secondary | ICD-10-CM | POA: Diagnosis not present

## 2017-07-28 DIAGNOSIS — M1991 Primary osteoarthritis, unspecified site: Secondary | ICD-10-CM | POA: Diagnosis not present

## 2017-07-28 DIAGNOSIS — Z125 Encounter for screening for malignant neoplasm of prostate: Secondary | ICD-10-CM | POA: Diagnosis not present

## 2017-07-28 DIAGNOSIS — Z1389 Encounter for screening for other disorder: Secondary | ICD-10-CM | POA: Diagnosis not present

## 2017-07-28 DIAGNOSIS — R946 Abnormal results of thyroid function studies: Secondary | ICD-10-CM | POA: Diagnosis not present

## 2017-07-28 DIAGNOSIS — E663 Overweight: Secondary | ICD-10-CM | POA: Diagnosis not present

## 2017-09-22 DIAGNOSIS — Z1211 Encounter for screening for malignant neoplasm of colon: Secondary | ICD-10-CM | POA: Diagnosis not present

## 2017-10-05 DIAGNOSIS — Z1211 Encounter for screening for malignant neoplasm of colon: Secondary | ICD-10-CM | POA: Diagnosis not present

## 2017-10-22 DIAGNOSIS — Z6829 Body mass index (BMI) 29.0-29.9, adult: Secondary | ICD-10-CM | POA: Diagnosis not present

## 2017-10-22 DIAGNOSIS — M1991 Primary osteoarthritis, unspecified site: Secondary | ICD-10-CM | POA: Diagnosis not present

## 2017-10-22 DIAGNOSIS — I10 Essential (primary) hypertension: Secondary | ICD-10-CM | POA: Diagnosis not present

## 2017-10-22 DIAGNOSIS — Z1389 Encounter for screening for other disorder: Secondary | ICD-10-CM | POA: Diagnosis not present

## 2017-10-22 DIAGNOSIS — E663 Overweight: Secondary | ICD-10-CM | POA: Diagnosis not present

## 2017-12-20 DIAGNOSIS — E663 Overweight: Secondary | ICD-10-CM | POA: Diagnosis not present

## 2017-12-20 DIAGNOSIS — Z6829 Body mass index (BMI) 29.0-29.9, adult: Secondary | ICD-10-CM | POA: Diagnosis not present

## 2017-12-20 DIAGNOSIS — G894 Chronic pain syndrome: Secondary | ICD-10-CM | POA: Diagnosis not present

## 2017-12-20 DIAGNOSIS — I10 Essential (primary) hypertension: Secondary | ICD-10-CM | POA: Diagnosis not present

## 2018-02-08 DIAGNOSIS — M25551 Pain in right hip: Secondary | ICD-10-CM | POA: Diagnosis not present

## 2018-03-08 DIAGNOSIS — Z681 Body mass index (BMI) 19 or less, adult: Secondary | ICD-10-CM | POA: Diagnosis not present

## 2018-03-08 DIAGNOSIS — S76011A Strain of muscle, fascia and tendon of right hip, initial encounter: Secondary | ICD-10-CM | POA: Diagnosis not present

## 2018-03-08 DIAGNOSIS — F419 Anxiety disorder, unspecified: Secondary | ICD-10-CM | POA: Diagnosis not present

## 2018-03-08 DIAGNOSIS — M1991 Primary osteoarthritis, unspecified site: Secondary | ICD-10-CM | POA: Diagnosis not present

## 2018-05-20 DIAGNOSIS — Z1211 Encounter for screening for malignant neoplasm of colon: Secondary | ICD-10-CM | POA: Diagnosis not present

## 2018-05-30 DIAGNOSIS — E663 Overweight: Secondary | ICD-10-CM | POA: Diagnosis not present

## 2018-05-30 DIAGNOSIS — M1991 Primary osteoarthritis, unspecified site: Secondary | ICD-10-CM | POA: Diagnosis not present

## 2018-05-30 DIAGNOSIS — Z6825 Body mass index (BMI) 25.0-25.9, adult: Secondary | ICD-10-CM | POA: Diagnosis not present

## 2018-05-30 DIAGNOSIS — G894 Chronic pain syndrome: Secondary | ICD-10-CM | POA: Diagnosis not present

## 2018-05-30 DIAGNOSIS — I158 Other secondary hypertension: Secondary | ICD-10-CM | POA: Diagnosis not present

## 2018-05-30 DIAGNOSIS — I10 Essential (primary) hypertension: Secondary | ICD-10-CM | POA: Diagnosis not present

## 2018-08-01 DIAGNOSIS — I10 Essential (primary) hypertension: Secondary | ICD-10-CM | POA: Diagnosis not present

## 2018-08-01 DIAGNOSIS — E663 Overweight: Secondary | ICD-10-CM | POA: Diagnosis not present

## 2018-08-01 DIAGNOSIS — G894 Chronic pain syndrome: Secondary | ICD-10-CM | POA: Diagnosis not present

## 2018-08-01 DIAGNOSIS — M1991 Primary osteoarthritis, unspecified site: Secondary | ICD-10-CM | POA: Diagnosis not present

## 2018-08-01 DIAGNOSIS — Z6826 Body mass index (BMI) 26.0-26.9, adult: Secondary | ICD-10-CM | POA: Diagnosis not present

## 2018-08-01 DIAGNOSIS — Z1389 Encounter for screening for other disorder: Secondary | ICD-10-CM | POA: Diagnosis not present

## 2018-08-01 DIAGNOSIS — F419 Anxiety disorder, unspecified: Secondary | ICD-10-CM | POA: Diagnosis not present

## 2018-10-26 DIAGNOSIS — G894 Chronic pain syndrome: Secondary | ICD-10-CM | POA: Diagnosis not present

## 2018-10-26 DIAGNOSIS — M1991 Primary osteoarthritis, unspecified site: Secondary | ICD-10-CM | POA: Diagnosis not present

## 2018-10-26 DIAGNOSIS — I10 Essential (primary) hypertension: Secondary | ICD-10-CM | POA: Diagnosis not present

## 2018-10-26 DIAGNOSIS — Z6826 Body mass index (BMI) 26.0-26.9, adult: Secondary | ICD-10-CM | POA: Diagnosis not present

## 2018-10-26 DIAGNOSIS — F419 Anxiety disorder, unspecified: Secondary | ICD-10-CM | POA: Diagnosis not present

## 2018-11-23 DIAGNOSIS — F419 Anxiety disorder, unspecified: Secondary | ICD-10-CM | POA: Diagnosis not present

## 2018-11-23 DIAGNOSIS — Z125 Encounter for screening for malignant neoplasm of prostate: Secondary | ICD-10-CM | POA: Diagnosis not present

## 2018-11-23 DIAGNOSIS — Z0001 Encounter for general adult medical examination with abnormal findings: Secondary | ICD-10-CM | POA: Diagnosis not present

## 2018-11-23 DIAGNOSIS — Z0000 Encounter for general adult medical examination without abnormal findings: Secondary | ICD-10-CM | POA: Diagnosis not present

## 2018-11-23 DIAGNOSIS — Z6826 Body mass index (BMI) 26.0-26.9, adult: Secondary | ICD-10-CM | POA: Diagnosis not present

## 2018-11-23 DIAGNOSIS — G894 Chronic pain syndrome: Secondary | ICD-10-CM | POA: Diagnosis not present

## 2018-11-23 DIAGNOSIS — I10 Essential (primary) hypertension: Secondary | ICD-10-CM | POA: Diagnosis not present

## 2018-11-23 DIAGNOSIS — Z1389 Encounter for screening for other disorder: Secondary | ICD-10-CM | POA: Diagnosis not present

## 2018-11-23 DIAGNOSIS — M1991 Primary osteoarthritis, unspecified site: Secondary | ICD-10-CM | POA: Diagnosis not present

## 2018-11-23 DIAGNOSIS — E663 Overweight: Secondary | ICD-10-CM | POA: Diagnosis not present

## 2018-12-30 DIAGNOSIS — G894 Chronic pain syndrome: Secondary | ICD-10-CM | POA: Diagnosis not present

## 2018-12-30 DIAGNOSIS — Z6827 Body mass index (BMI) 27.0-27.9, adult: Secondary | ICD-10-CM | POA: Diagnosis not present

## 2018-12-30 DIAGNOSIS — I10 Essential (primary) hypertension: Secondary | ICD-10-CM | POA: Diagnosis not present

## 2018-12-30 DIAGNOSIS — M1991 Primary osteoarthritis, unspecified site: Secondary | ICD-10-CM | POA: Diagnosis not present

## 2019-03-07 DIAGNOSIS — E669 Obesity, unspecified: Secondary | ICD-10-CM | POA: Diagnosis not present

## 2019-03-07 DIAGNOSIS — M150 Primary generalized (osteo)arthritis: Secondary | ICD-10-CM | POA: Diagnosis not present

## 2019-03-07 DIAGNOSIS — I10 Essential (primary) hypertension: Secondary | ICD-10-CM | POA: Diagnosis not present

## 2019-03-27 DIAGNOSIS — I10 Essential (primary) hypertension: Secondary | ICD-10-CM | POA: Diagnosis not present

## 2019-03-27 DIAGNOSIS — Z6824 Body mass index (BMI) 24.0-24.9, adult: Secondary | ICD-10-CM | POA: Diagnosis not present

## 2019-03-27 DIAGNOSIS — M2550 Pain in unspecified joint: Secondary | ICD-10-CM | POA: Diagnosis not present

## 2019-03-27 DIAGNOSIS — M1991 Primary osteoarthritis, unspecified site: Secondary | ICD-10-CM | POA: Diagnosis not present

## 2019-03-27 DIAGNOSIS — Z1389 Encounter for screening for other disorder: Secondary | ICD-10-CM | POA: Diagnosis not present

## 2019-03-27 DIAGNOSIS — G894 Chronic pain syndrome: Secondary | ICD-10-CM | POA: Diagnosis not present

## 2019-05-08 DIAGNOSIS — M1991 Primary osteoarthritis, unspecified site: Secondary | ICD-10-CM | POA: Diagnosis not present

## 2019-05-08 DIAGNOSIS — I10 Essential (primary) hypertension: Secondary | ICD-10-CM | POA: Diagnosis not present

## 2019-05-08 DIAGNOSIS — E669 Obesity, unspecified: Secondary | ICD-10-CM | POA: Diagnosis not present

## 2019-05-08 DIAGNOSIS — M150 Primary generalized (osteo)arthritis: Secondary | ICD-10-CM | POA: Diagnosis not present

## 2019-05-11 DIAGNOSIS — G894 Chronic pain syndrome: Secondary | ICD-10-CM | POA: Diagnosis not present

## 2019-06-23 DIAGNOSIS — M1991 Primary osteoarthritis, unspecified site: Secondary | ICD-10-CM | POA: Diagnosis not present

## 2019-06-23 DIAGNOSIS — G894 Chronic pain syndrome: Secondary | ICD-10-CM | POA: Diagnosis not present

## 2019-07-21 DIAGNOSIS — G894 Chronic pain syndrome: Secondary | ICD-10-CM | POA: Diagnosis not present

## 2019-08-21 DIAGNOSIS — G894 Chronic pain syndrome: Secondary | ICD-10-CM | POA: Diagnosis not present

## 2019-09-12 DIAGNOSIS — M1991 Primary osteoarthritis, unspecified site: Secondary | ICD-10-CM | POA: Diagnosis not present

## 2019-09-12 DIAGNOSIS — G894 Chronic pain syndrome: Secondary | ICD-10-CM | POA: Diagnosis not present

## 2019-10-06 DIAGNOSIS — M1991 Primary osteoarthritis, unspecified site: Secondary | ICD-10-CM | POA: Diagnosis not present

## 2019-10-06 DIAGNOSIS — E6609 Other obesity due to excess calories: Secondary | ICD-10-CM | POA: Diagnosis not present

## 2019-10-06 DIAGNOSIS — I10 Essential (primary) hypertension: Secondary | ICD-10-CM | POA: Diagnosis not present

## 2019-10-17 DIAGNOSIS — M2550 Pain in unspecified joint: Secondary | ICD-10-CM | POA: Diagnosis not present

## 2019-10-17 DIAGNOSIS — G894 Chronic pain syndrome: Secondary | ICD-10-CM | POA: Diagnosis not present

## 2019-10-28 DIAGNOSIS — Z1211 Encounter for screening for malignant neoplasm of colon: Secondary | ICD-10-CM | POA: Diagnosis not present

## 2019-11-14 DIAGNOSIS — M1991 Primary osteoarthritis, unspecified site: Secondary | ICD-10-CM | POA: Diagnosis not present

## 2019-11-14 DIAGNOSIS — G894 Chronic pain syndrome: Secondary | ICD-10-CM | POA: Diagnosis not present

## 2019-11-14 DIAGNOSIS — M2550 Pain in unspecified joint: Secondary | ICD-10-CM | POA: Diagnosis not present

## 2019-12-06 DIAGNOSIS — G894 Chronic pain syndrome: Secondary | ICD-10-CM | POA: Diagnosis not present

## 2020-01-08 DIAGNOSIS — M1991 Primary osteoarthritis, unspecified site: Secondary | ICD-10-CM | POA: Diagnosis not present

## 2020-01-08 DIAGNOSIS — G894 Chronic pain syndrome: Secondary | ICD-10-CM | POA: Diagnosis not present

## 2020-02-01 DIAGNOSIS — G894 Chronic pain syndrome: Secondary | ICD-10-CM | POA: Diagnosis not present

## 2020-03-19 DIAGNOSIS — Z125 Encounter for screening for malignant neoplasm of prostate: Secondary | ICD-10-CM | POA: Diagnosis not present

## 2020-03-19 DIAGNOSIS — E039 Hypothyroidism, unspecified: Secondary | ICD-10-CM | POA: Diagnosis not present

## 2020-03-19 DIAGNOSIS — Z1389 Encounter for screening for other disorder: Secondary | ICD-10-CM | POA: Diagnosis not present

## 2020-03-19 DIAGNOSIS — Z0001 Encounter for general adult medical examination with abnormal findings: Secondary | ICD-10-CM | POA: Diagnosis not present

## 2020-03-19 DIAGNOSIS — Z6826 Body mass index (BMI) 26.0-26.9, adult: Secondary | ICD-10-CM | POA: Diagnosis not present

## 2020-03-19 DIAGNOSIS — E663 Overweight: Secondary | ICD-10-CM | POA: Diagnosis not present

## 2020-04-05 DIAGNOSIS — M1991 Primary osteoarthritis, unspecified site: Secondary | ICD-10-CM | POA: Diagnosis not present

## 2020-04-05 DIAGNOSIS — I10 Essential (primary) hypertension: Secondary | ICD-10-CM | POA: Diagnosis not present

## 2020-04-10 DIAGNOSIS — M1991 Primary osteoarthritis, unspecified site: Secondary | ICD-10-CM | POA: Diagnosis not present

## 2020-04-10 DIAGNOSIS — G894 Chronic pain syndrome: Secondary | ICD-10-CM | POA: Diagnosis not present

## 2020-05-08 DIAGNOSIS — G894 Chronic pain syndrome: Secondary | ICD-10-CM | POA: Diagnosis not present

## 2020-05-23 DIAGNOSIS — G894 Chronic pain syndrome: Secondary | ICD-10-CM | POA: Diagnosis not present

## 2020-05-23 DIAGNOSIS — M1991 Primary osteoarthritis, unspecified site: Secondary | ICD-10-CM | POA: Diagnosis not present

## 2020-06-19 DIAGNOSIS — G894 Chronic pain syndrome: Secondary | ICD-10-CM | POA: Diagnosis not present

## 2020-06-19 DIAGNOSIS — M1991 Primary osteoarthritis, unspecified site: Secondary | ICD-10-CM | POA: Diagnosis not present

## 2020-06-19 DIAGNOSIS — I10 Essential (primary) hypertension: Secondary | ICD-10-CM | POA: Diagnosis not present

## 2020-06-19 DIAGNOSIS — Z6827 Body mass index (BMI) 27.0-27.9, adult: Secondary | ICD-10-CM | POA: Diagnosis not present

## 2020-07-18 DIAGNOSIS — G894 Chronic pain syndrome: Secondary | ICD-10-CM | POA: Diagnosis not present

## 2020-07-18 DIAGNOSIS — M1991 Primary osteoarthritis, unspecified site: Secondary | ICD-10-CM | POA: Diagnosis not present

## 2020-07-18 DIAGNOSIS — I10 Essential (primary) hypertension: Secondary | ICD-10-CM | POA: Diagnosis not present

## 2020-08-28 DIAGNOSIS — I10 Essential (primary) hypertension: Secondary | ICD-10-CM | POA: Diagnosis not present

## 2020-08-28 DIAGNOSIS — M1991 Primary osteoarthritis, unspecified site: Secondary | ICD-10-CM | POA: Diagnosis not present

## 2020-08-28 DIAGNOSIS — G894 Chronic pain syndrome: Secondary | ICD-10-CM | POA: Diagnosis not present

## 2020-10-11 DIAGNOSIS — G894 Chronic pain syndrome: Secondary | ICD-10-CM | POA: Diagnosis not present

## 2020-12-06 DIAGNOSIS — M150 Primary generalized (osteo)arthritis: Secondary | ICD-10-CM | POA: Diagnosis not present

## 2020-12-06 DIAGNOSIS — M1991 Primary osteoarthritis, unspecified site: Secondary | ICD-10-CM | POA: Diagnosis not present

## 2020-12-06 DIAGNOSIS — E663 Overweight: Secondary | ICD-10-CM | POA: Diagnosis not present

## 2020-12-06 DIAGNOSIS — F17210 Nicotine dependence, cigarettes, uncomplicated: Secondary | ICD-10-CM | POA: Diagnosis not present

## 2020-12-06 DIAGNOSIS — G894 Chronic pain syndrome: Secondary | ICD-10-CM | POA: Diagnosis not present

## 2020-12-06 DIAGNOSIS — Z6828 Body mass index (BMI) 28.0-28.9, adult: Secondary | ICD-10-CM | POA: Diagnosis not present

## 2020-12-06 DIAGNOSIS — Z719 Counseling, unspecified: Secondary | ICD-10-CM | POA: Diagnosis not present

## 2020-12-06 DIAGNOSIS — F17200 Nicotine dependence, unspecified, uncomplicated: Secondary | ICD-10-CM | POA: Diagnosis not present

## 2021-01-10 DIAGNOSIS — I10 Essential (primary) hypertension: Secondary | ICD-10-CM | POA: Diagnosis not present

## 2021-01-10 DIAGNOSIS — G894 Chronic pain syndrome: Secondary | ICD-10-CM | POA: Diagnosis not present

## 2021-01-10 DIAGNOSIS — M1991 Primary osteoarthritis, unspecified site: Secondary | ICD-10-CM | POA: Diagnosis not present

## 2021-03-14 DIAGNOSIS — G894 Chronic pain syndrome: Secondary | ICD-10-CM | POA: Diagnosis not present

## 2021-03-14 DIAGNOSIS — M1991 Primary osteoarthritis, unspecified site: Secondary | ICD-10-CM | POA: Diagnosis not present

## 2021-03-14 DIAGNOSIS — Z6825 Body mass index (BMI) 25.0-25.9, adult: Secondary | ICD-10-CM | POA: Diagnosis not present

## 2021-03-14 DIAGNOSIS — I10 Essential (primary) hypertension: Secondary | ICD-10-CM | POA: Diagnosis not present

## 2021-03-14 DIAGNOSIS — E663 Overweight: Secondary | ICD-10-CM | POA: Diagnosis not present

## 2021-05-15 DIAGNOSIS — Z1331 Encounter for screening for depression: Secondary | ICD-10-CM | POA: Diagnosis not present

## 2021-05-15 DIAGNOSIS — G894 Chronic pain syndrome: Secondary | ICD-10-CM | POA: Diagnosis not present

## 2021-05-15 DIAGNOSIS — M1991 Primary osteoarthritis, unspecified site: Secondary | ICD-10-CM | POA: Diagnosis not present

## 2021-05-15 DIAGNOSIS — Z6826 Body mass index (BMI) 26.0-26.9, adult: Secondary | ICD-10-CM | POA: Diagnosis not present

## 2021-05-15 DIAGNOSIS — I10 Essential (primary) hypertension: Secondary | ICD-10-CM | POA: Diagnosis not present

## 2021-05-15 DIAGNOSIS — E663 Overweight: Secondary | ICD-10-CM | POA: Diagnosis not present

## 2021-05-15 DIAGNOSIS — I158 Other secondary hypertension: Secondary | ICD-10-CM | POA: Diagnosis not present

## 2021-05-15 DIAGNOSIS — M4014 Other secondary kyphosis, thoracic region: Secondary | ICD-10-CM | POA: Diagnosis not present

## 2021-05-15 DIAGNOSIS — Z0001 Encounter for general adult medical examination with abnormal findings: Secondary | ICD-10-CM | POA: Diagnosis not present

## 2021-05-15 DIAGNOSIS — Z1389 Encounter for screening for other disorder: Secondary | ICD-10-CM | POA: Diagnosis not present

## 2021-07-15 DIAGNOSIS — G894 Chronic pain syndrome: Secondary | ICD-10-CM | POA: Diagnosis not present

## 2021-07-15 DIAGNOSIS — I10 Essential (primary) hypertension: Secondary | ICD-10-CM | POA: Diagnosis not present

## 2021-07-15 DIAGNOSIS — E663 Overweight: Secondary | ICD-10-CM | POA: Diagnosis not present

## 2021-07-15 DIAGNOSIS — Z6826 Body mass index (BMI) 26.0-26.9, adult: Secondary | ICD-10-CM | POA: Diagnosis not present

## 2021-07-15 DIAGNOSIS — M150 Primary generalized (osteo)arthritis: Secondary | ICD-10-CM | POA: Diagnosis not present

## 2021-08-06 DIAGNOSIS — Z1211 Encounter for screening for malignant neoplasm of colon: Secondary | ICD-10-CM | POA: Diagnosis not present

## 2021-08-06 DIAGNOSIS — Z1212 Encounter for screening for malignant neoplasm of rectum: Secondary | ICD-10-CM | POA: Diagnosis not present

## 2021-08-11 DIAGNOSIS — M150 Primary generalized (osteo)arthritis: Secondary | ICD-10-CM | POA: Diagnosis not present

## 2021-08-11 DIAGNOSIS — G894 Chronic pain syndrome: Secondary | ICD-10-CM | POA: Diagnosis not present

## 2021-08-26 DIAGNOSIS — I10 Essential (primary) hypertension: Secondary | ICD-10-CM | POA: Diagnosis not present

## 2021-08-26 DIAGNOSIS — M150 Primary generalized (osteo)arthritis: Secondary | ICD-10-CM | POA: Diagnosis not present

## 2021-08-26 DIAGNOSIS — G894 Chronic pain syndrome: Secondary | ICD-10-CM | POA: Diagnosis not present

## 2022-04-13 ENCOUNTER — Encounter: Payer: Self-pay | Admitting: *Deleted

## 2023-08-08 DEATH — deceased
